# Patient Record
Sex: Female | Born: 1975
Health system: Southern US, Community
[De-identification: ages and names within clinical notes are randomized; demographics above are authoritative.]

## PROBLEM LIST (undated history)

## (undated) DIAGNOSIS — L659 Nonscarring hair loss, unspecified: Secondary | ICD-10-CM

## (undated) DIAGNOSIS — Z803 Family history of malignant neoplasm of breast: Secondary | ICD-10-CM

## (undated) DIAGNOSIS — K219 Gastro-esophageal reflux disease without esophagitis: Secondary | ICD-10-CM

## (undated) DIAGNOSIS — K625 Hemorrhage of anus and rectum: Secondary | ICD-10-CM

## (undated) DIAGNOSIS — Z1371 Encounter for nonprocreative screening for genetic disease carrier status: Secondary | ICD-10-CM

## (undated) DIAGNOSIS — K649 Unspecified hemorrhoids: Secondary | ICD-10-CM

## (undated) HISTORY — PX: NO PAST SURGERIES: SHX2092

## (undated) HISTORY — DX: Hemorrhage of anus and rectum: K62.5

## (undated) HISTORY — DX: Nonscarring hair loss, unspecified: L65.9

## (undated) HISTORY — DX: Encounter for nonprocreative screening for genetic disease carrier status: Z13.71

## (undated) HISTORY — DX: Family history of malignant neoplasm of breast: Z80.3

## (undated) HISTORY — DX: Unspecified hemorrhoids: K64.9

## (undated) HISTORY — DX: Gastro-esophageal reflux disease without esophagitis: K21.9

---

## 2005-10-06 ENCOUNTER — Ambulatory Visit: Payer: Self-pay

## 2005-10-12 ENCOUNTER — Emergency Department: Payer: Self-pay | Admitting: Emergency Medicine

## 2007-05-17 ENCOUNTER — Emergency Department: Payer: Self-pay | Admitting: Emergency Medicine

## 2007-10-21 ENCOUNTER — Ambulatory Visit: Payer: Self-pay | Admitting: General Surgery

## 2009-01-03 ENCOUNTER — Ambulatory Visit: Payer: Self-pay

## 2010-09-16 ENCOUNTER — Ambulatory Visit: Payer: Self-pay | Admitting: Internal Medicine

## 2010-12-12 ENCOUNTER — Ambulatory Visit: Payer: Self-pay | Admitting: Unknown Physician Specialty

## 2010-12-19 ENCOUNTER — Ambulatory Visit: Payer: Self-pay | Admitting: Family Medicine

## 2012-10-02 LAB — HM PAP SMEAR: HM PAP: NORMAL

## 2012-12-24 ENCOUNTER — Other Ambulatory Visit: Payer: Self-pay

## 2012-12-24 LAB — COMPREHENSIVE METABOLIC PANEL
BUN: 10 mg/dL (ref 7–18)
Bilirubin,Total: 0.8 mg/dL (ref 0.2–1.0)
Chloride: 104 mmol/L (ref 98–107)
Creatinine: 0.79 mg/dL (ref 0.60–1.30)
EGFR (Non-African Amer.): >60
Glucose: 85 mg/dL (ref 65–99)
Osmolality: 270 (ref 275–301)
SGOT(AST): 22 U/L (ref 15–37)
Sodium: 136 mmol/L (ref 136–145)

## 2012-12-24 LAB — CBC WITH DIFFERENTIAL/PLATELET
Basophil #: 0 10*3/uL (ref 0.0–0.1)
Basophil %: 0.7 %
HCT: 36.3 % (ref 35.0–47.0)
HGB: 12.3 g/dL (ref 12.0–16.0)
Lymphocyte #: 1.8 10*3/uL (ref 1.0–3.6)
Lymphocyte %: 40.4 %
Monocyte %: 8 %
Neutrophil #: 2.1 10*3/uL (ref 1.4–6.5)
Neutrophil %: 49 %
Platelet: 248 10*3/uL (ref 150–440)
RBC: 3.95 10*6/uL (ref 3.80–5.20)
WBC: 4.3 10*3/uL (ref 3.6–11.0)

## 2012-12-24 LAB — LIPID PANEL
Cholesterol: 182 mg/dL (ref 0–200)
HDL Cholesterol: 65 mg/dL — ABNORMAL HIGH (ref 40–60)
Ldl Cholesterol, Calc: 110 mg/dL — ABNORMAL HIGH (ref 0–100)
Triglycerides: 33 mg/dL (ref 0–200)
VLDL Cholesterol, Calc: 7 mg/dL (ref 5–40)

## 2012-12-24 LAB — TSH: Thyroid Stimulating Horm: 0.469 u[IU]/mL

## 2012-12-24 LAB — HEMOGLOBIN A1C: Hemoglobin A1C: 5.6 % (ref 4.2–6.3)

## 2013-01-25 ENCOUNTER — Ambulatory Visit: Payer: Self-pay | Admitting: General Surgery

## 2013-02-10 ENCOUNTER — Encounter: Payer: Self-pay | Admitting: General Surgery

## 2013-02-10 ENCOUNTER — Ambulatory Visit (INDEPENDENT_AMBULATORY_CARE_PROVIDER_SITE_OTHER): Payer: 59 | Admitting: General Surgery

## 2013-02-10 VITALS — BP 130/80 | HR 76 | Resp 14 | Ht 64.0 in | Wt 180.0 lb

## 2013-02-10 DIAGNOSIS — K625 Hemorrhage of anus and rectum: Secondary | ICD-10-CM | POA: Insufficient documentation

## 2013-02-10 HISTORY — DX: Hemorrhage of anus and rectum: K62.5

## 2013-02-10 LAB — HEMOCCULT GUIAC POC 1CARD (OFFICE): Fecal Occult Blood, POC: NEGATIVE

## 2013-02-10 NOTE — Progress Notes (Signed)
Patient ID: Kelly Sparks, female   DOB: [birth year], 37 y.o.   MRN: [id number]  Chief Complaint  Patient presents with  . Rectal Bleeding    HPI Kelly Sparks is a 37 y.o. female.  Patient here today for evaluation of rectal bleeding for about 2 months.  Patient referred by Dr Luella Cook. Patient state she have not been bleeding in the last month. Has a history of hemorrhoids.No family history of colon cancer.  States she does have trouble with constipation, bleeding on tissue and in toilet.  In the past she has seen blood in stool. She has known history of hemorrhoids and uses preparation H cream occasionally. The patient reports a long history of intermittent passage of bright red blood per rectum, especially when straining at stool. The most recent episode 2 months ago was unusual in that she would be aware of blood dripping in the toilet bowl when urinating. This is now resolved, and she reports no bleeding for the past month.  There is no history of anorectal trauma. HPI  Past Medical History  Diagnosis Date  . Hemorrhoids   . Rectal bleeding 02/10/2013    History reviewed. No pertinent past surgical history.  Family History  Problem Relation Age of Onset  . Breast cancer Maternal Aunt   . Cancer Father     lung    Social History History  Substance Use Topics  . Smoking status: Never Smoker   . Smokeless tobacco: Never Used  . Alcohol Use: No    No Known Allergies  No current outpatient prescriptions on file.   No current facility-administered medications for this visit.    Review of Systems Review of Systems  Constitutional: Negative.   Respiratory: Negative.   Cardiovascular: Negative.   Gastrointestinal: Positive for constipation.    Blood pressure 130/80, pulse 76, resp. rate 14, height 5\' 4"  (1.626 m), weight 180 lb (81.647 kg), last menstrual period 01/18/2013.  Physical Exam Physical Exam  Constitutional: She is oriented to person, place, and time. She  appears well-developed and well-nourished.  Cardiovascular: Normal rate and regular rhythm.   Pulmonary/Chest: Effort normal and breath sounds normal.  Abdominal: Soft. Bowel sounds are normal.  Neurological: She is alert and oriented to person, place, and time.  Skin: Skin is warm and dry.    Data Reviewed  Anoscopy showed the visualized lower rectal mucosa to be normal. No internal or external hemorrhoids noted. No fissure. Stool is Hemoccult negative.  Assessment    Likely anorectal source, very unlikely colon cancer based on the patient's report of blood passage per rectum with urination.     Plan    The patient is presently asymptomatic. She was encouraged to make use of fiber supplements to improve the stool frequency and ease of passage.  Should she develop recurrent bleeding, she was encouraged to call promptly for early assessment. We'll consider colonoscopy at that time if recurrent bleeding develops. In the absence of any family history of colon cancer or polyps, I don't believe this is necessary at this time.        [name] 02/10/2013, 10:04 PM

## 2013-02-10 NOTE — Patient Instructions (Addendum)
Increase fiber in diet, Fibercon or wafers etc.  If bleeding continues or worsens then consider colonoscopy

## 2013-03-12 ENCOUNTER — Ambulatory Visit: Payer: Self-pay | Admitting: Family Medicine

## 2013-03-12 LAB — OCCULT BLOOD X 1 CARD TO LAB, STOOL: Occult Blood, Feces: NEGATIVE

## 2014-02-10 ENCOUNTER — Encounter (INDEPENDENT_AMBULATORY_CARE_PROVIDER_SITE_OTHER): Payer: Self-pay

## 2014-02-10 ENCOUNTER — Encounter: Payer: Self-pay | Admitting: Internal Medicine

## 2014-02-10 ENCOUNTER — Ambulatory Visit (INDEPENDENT_AMBULATORY_CARE_PROVIDER_SITE_OTHER): Payer: 59 | Admitting: Internal Medicine

## 2014-02-10 VITALS — BP 118/72 | HR 66 | Temp 98.6°F | Ht 63.75 in | Wt 179.0 lb

## 2014-02-10 DIAGNOSIS — Z0000 Encounter for general adult medical examination without abnormal findings: Secondary | ICD-10-CM

## 2014-02-10 LAB — CBC
HEMATOCRIT: 35.4 % — AB (ref 36.0–46.0)
HEMOGLOBIN: 12.2 g/dL (ref 12.0–15.0)
MCH: 30.9 pg (ref 26.0–34.0)
MCHC: 34.5 g/dL (ref 30.0–36.0)
MCV: 89.6 fL (ref 78.0–100.0)
Platelets: 296 10*3/uL (ref 150–400)
RBC: 3.95 MIL/uL (ref 3.87–5.11)
RDW: 13.9 % (ref 11.5–15.5)
WBC: 6.4 10*3/uL (ref 4.0–10.5)

## 2014-02-10 LAB — HEMOGLOBIN A1C
Hgb A1c MFr Bld: 5.8 % — ABNORMAL HIGH (ref <5.7)
Mean Plasma Glucose: 120 mg/dL — ABNORMAL HIGH (ref <117)

## 2014-02-10 NOTE — Progress Notes (Signed)
Pre visit review using our clinic review tool, if applicable. No additional management support is needed unless otherwise documented below in the visit note. 

## 2014-02-10 NOTE — Patient Instructions (Addendum)

## 2014-02-10 NOTE — Progress Notes (Signed)
HPI  Pt presents to the clinic today to establish care. She has not had a PCP in many years.  Flu: 06/2013 Tetanus: 2013 LMP: 02/01/14 Pap Smear: 2014 Mammogram: 2013 Dentist: biannually  Past Medical History  Diagnosis Date  . Hemorrhoids   . Rectal bleeding 02/10/2013    Current Outpatient Prescriptions  Medication Sig Dispense Refill  . Multiple Vitamin (MULTIVITAMIN) tablet Take 1 tablet by mouth daily.      . vitamin B-12 (CYANOCOBALAMIN) 1000 MCG tablet Take 1,000 mcg by mouth daily.       No current facility-administered medications for this visit.    No Known Allergies  Family History  Problem Relation Age of Onset  . Breast cancer Maternal Aunt   . Cancer Maternal Aunt     Breast  . Cancer Father     lung  . Hyperlipidemia Father   . Hypertension Father   . Hyperlipidemia Mother   . Hypertension Mother   . Diabetes Maternal Uncle     History   Social History  . Marital Status: Married    Spouse Name: N/A    Number of Children: N/A  . Years of Education: N/A   Occupational History  . Not on file.   Social History Main Topics  . Smoking status: Never Smoker   . Smokeless tobacco: Never Used  . Alcohol Use: Yes     Comment: occasional  . Drug Use: No  . Sexual Activity: Not on file   Other Topics Concern  . Not on file   Social History Narrative  . No narrative on file    ROS:  Constitutional: Denies fever, malaise, fatigue, headache or abrupt weight changes.  HEENT: Denies eye pain, eye redness, ear pain, ringing in the ears, wax buildup, runny nose, nasal congestion, bloody nose, or sore throat. Respiratory: Denies difficulty breathing, shortness of breath, cough or sputum production.   Cardiovascular: Denies chest pain, chest tightness, palpitations or swelling in the hands or feet.  Gastrointestinal: Denies abdominal pain, bloating, constipation, diarrhea or blood in the stool.  GU: Denies frequency, urgency, pain with urination, blood  in urine, odor or discharge. Musculoskeletal: Denies decrease in range of motion, difficulty with gait, muscle pain or joint pain and swelling.  Skin: Denies redness, rashes, lesions or ulcercations.  Neurological: Denies dizziness, difficulty with memory, difficulty with speech or problems with balance and coordination.   No other specific complaints in a complete review of systems (except as listed in HPI above).  PE:  BP 118/72  Pulse 66  Temp(Src) 98.6 F (37 C) (Oral)  Ht 5' 3.75" (1.619 m)  Wt 179 lb (81.194 kg)  BMI 30.98 kg/m2  SpO2 99%  LMP 02/03/2014 Wt Readings from Last 3 Encounters:  02/10/14 179 lb (81.194 kg)  02/10/13 180 lb (81.647 kg)    General: Appears her stated age, well developed, well nourished in NAD. HEENT: Head: normal shape and size; Eyes: sclera white, no icterus, conjunctiva pink, PERRLA and EOMs intact; Ears: Tm's gray and intact, normal light reflex; Nose: mucosa pink and moist, septum midline; Throat/Mouth: Teeth present, mucosa pink and moist, no lesions or ulcerations noted.  Neck: Normal range of motion. Neck supple, trachea midline. No massses, lumps or thyromegaly present.  Cardiovascular: Normal rate and rhythm. S1,S2 noted.  No murmur, rubs or gallops noted. No JVD or BLE edema. No carotid bruits noted. Pulmonary/Chest: Normal effort and positive vesicular breath sounds. No respiratory distress. No wheezes, rales or ronchi noted.  Abdomen:  Soft and nontender. Normal bowel sounds, no bruits noted. No distention or masses noted. Liver, spleen and kidneys non palpable. Musculoskeletal: Normal range of motion. No signs of joint swelling. No difficulty with gait.  Neurological: Alert and oriented. Cranial nerves II-XII intact. Coordination normal. +DTRs bilaterally. Psychiatric: Mood and affect normal. Behavior is normal. Judgment and thought content normal.      Assessment and Plan:  Preventative Health Maintenance:  All HM UTD Will check  screening labs today

## 2014-02-11 LAB — COMPREHENSIVE METABOLIC PANEL
ALBUMIN: 4.3 g/dL (ref 3.5–5.2)
ALK PHOS: 64 U/L (ref 39–117)
ALT: 12 U/L (ref 0–35)
AST: 16 U/L (ref 0–37)
BILIRUBIN TOTAL: 0.5 mg/dL (ref 0.2–1.2)
BUN: 10 mg/dL (ref 6–23)
CO2: 26 mEq/L (ref 19–32)
CREATININE: 0.87 mg/dL (ref 0.50–1.10)
Calcium: 9.2 mg/dL (ref 8.4–10.5)
Chloride: 103 mEq/L (ref 96–112)
GLUCOSE: 92 mg/dL (ref 70–99)
POTASSIUM: 4.2 meq/L (ref 3.5–5.3)
Sodium: 138 mEq/L (ref 135–145)
Total Protein: 6.9 g/dL (ref 6.0–8.3)

## 2014-02-11 LAB — LIPID PANEL
CHOL/HDL RATIO: 2.5 ratio
CHOLESTEROL: 162 mg/dL (ref 0–200)
HDL: 66 mg/dL (ref >39)
LDL CALC: 85 mg/dL (ref 0–99)
TRIGLYCERIDES: 53 mg/dL (ref <150)
VLDL: 11 mg/dL (ref 0–40)

## 2014-02-11 LAB — TSH: TSH: 0.436 u[IU]/mL (ref 0.350–4.500)

## 2014-06-19 ENCOUNTER — Ambulatory Visit: Payer: 59

## 2014-07-03 ENCOUNTER — Encounter: Payer: Self-pay | Admitting: Internal Medicine

## 2015-03-23 ENCOUNTER — Ambulatory Visit (INDEPENDENT_AMBULATORY_CARE_PROVIDER_SITE_OTHER): Payer: 59 | Admitting: Family Medicine

## 2015-03-23 VITALS — BP 102/72 | HR 94 | Temp 98.5°F | Resp 18 | Ht 64.0 in | Wt 174.0 lb

## 2015-03-23 DIAGNOSIS — J020 Streptococcal pharyngitis: Secondary | ICD-10-CM | POA: Diagnosis not present

## 2015-03-23 MED ORDER — MAGIC MOUTHWASH W/LIDOCAINE: 5.0000 mL | Freq: Four times a day (QID) | ORAL | Status: DC | PRN

## 2015-03-23 NOTE — Patient Instructions (Signed)
Continue the ibuprofen, cold fluids, lozenges as needed, magic mouthwash as needed. Continue amoxicillin. Watch for any swelling in back of throat as discussed and return here or emergency room if this occurs. Return to the clinic or go to the nearest emergency room if any of your symptoms worsen or new symptoms occur.  Strep Throat Strep throat is an infection of the throat caused by a bacteria named Streptococcus pyogenes. Your health care provider may call the infection streptococcal "tonsillitis" or "pharyngitis" depending on whether there are signs of inflammation in the tonsils or back of the throat. Strep throat is most common in children aged 5-15 years during the cold months of the year, but it can occur in people of any age during any season. This infection is spread from person to person (contagious) through coughing, sneezing, or other close contact. SIGNS AND SYMPTOMS   Fever or chills.  Painful, swollen, red tonsils or throat.  Pain or difficulty when swallowing.  White or yellow spots on the tonsils or throat.  Swollen, tender lymph nodes or "glands" of the neck or under the jaw.  Red rash all over the body (rare). DIAGNOSIS  Many different infections can cause the same symptoms. A test must be done to confirm the diagnosis so the right treatment can be given. A "rapid strep test" can help your health care provider make the diagnosis in a few minutes. If this test is not available, a light swab of the infected area can be used for a throat culture test. If a throat culture test is done, results are usually available in a day or two. TREATMENT  Strep throat is treated with antibiotic medicine. HOME CARE INSTRUCTIONS   Gargle with 1 tsp of salt in 1 cup of warm water, 3-4 times per day or as needed for comfort.  Family members who also have a sore throat or fever should be tested for strep throat and treated with antibiotics if they have the strep infection.  Make sure  everyone in your household washes their hands well.  Do not share food, drinking cups, or personal items that could cause the infection to spread to others.  You may need to eat a soft food diet until your sore throat gets better.  Drink enough water and fluids to keep your urine clear or pale yellow. This will help prevent dehydration.  Get plenty of rest.  Stay home from school, day care, or work until you have been on antibiotics for 24 hours.  Take medicines only as directed by your health care provider.  Take your antibiotic medicine as directed by your health care provider. Finish it even if you start to feel better. SEEK MEDICAL CARE IF:   The glands in your neck continue to enlarge.  You develop a rash, cough, or earache.  You cough up green, yellow-brown, or bloody sputum.  You have pain or discomfort not controlled by medicines.  Your problems seem to be getting worse rather than better.  You have a fever. SEEK IMMEDIATE MEDICAL CARE IF:   You develop any new symptoms such as vomiting, severe headache, stiff or painful neck, chest pain, shortness of breath, or trouble swallowing.  You develop severe throat pain, drooling, or changes in your voice.  You develop swelling of the neck, or the skin on the neck becomes red and tender.  You develop signs of dehydration, such as fatigue, dry mouth, and decreased urination.  You become increasingly sleepy, or you cannot wake up  completely. MAKE SURE YOU:  Understand these instructions.  Will watch your condition.  Will get help right away if you are not doing well or get worse. Document Released: 08/15/2000 Document Revised: 01/02/2014 Document Reviewed: 10/17/2010 [facility] Patient Information 2015 [place], [place]. This information is not intended to replace advice given to you by your health care provider. Make sure you discuss any questions you have with your health care provider.

## 2015-03-23 NOTE — Progress Notes (Addendum)
Subjective:  This chart was scribed for Meredith Staggers, MD by [facility], medical scribe at [facility].The patient was seen in exam room 01 and the patient's care was started at 10:15 AM.   Patient ID: Kelly Sparks, female    DOB: November 24, 1975, 39 y.o.   MRN: [id number] Chief Complaint  Patient presents with  . Sore Throat    x 2 days, getting worse, stiff neck   HPI HPI Comments: Kelly Sparks is a 39 y.o. female who presents to [facility] complaining of a sore throat and body aches began three days ago. A coworker did have strep throat. She was seen at the work clinic and diagnosed with strep throat two days, rapid strep test was positive. She was treated with amoxicillin 875 mg, five total doses, and no missed doses. Possible double doses today, she is unsure though.  Today, she is concerned because her symptoms have not improved after 48 hours of antibiotics. She has pain with swallowing, neck stiffness, and developed bilateral ear pain. She also complains of a subjective fever. Taking ibuprofen and Vicodin which is 39 years old for the fever.  There are no active problems to display for this patient.  Past Medical History  Diagnosis Date  . Hemorrhoids   . Rectal bleeding 02/10/2013   History reviewed. No pertinent past surgical history. No Known Allergies Prior to Admission medications   Medication Sig Start Date End Date Taking? Authorizing Provider  amoxicillin (AMOXIL) 875 MG tablet Take 875 mg by mouth 2 (two) times daily.   Yes Historical Provider, MD  hydrocodone-ibuprofen (VICO PROFEN) 5-200 MG per tablet Take 1 tablet by mouth every 8 (eight) hours as needed for pain.   Yes Historical Provider, MD  ibuprofen (ADVIL,MOTRIN) 800 MG tablet Take 800 mg by mouth every 8 (eight) hours as needed.   Yes Historical Provider, MD  Multiple Vitamin (MULTIVITAMIN) tablet Take 1 tablet by mouth daily.    Historical Provider, MD  vitamin  B-12 (CYANOCOBALAMIN) 1000 MCG tablet Take 1,000 mcg by mouth daily.    Historical Provider, MD   History   Social History  . Marital Status: Married    Spouse Name: N/A  . Number of Children: N/A  . Years of Education: N/A   Occupational History  . Not on file.   Social History Main Topics  . Smoking status: Never Smoker   . Smokeless tobacco: Never Used  . Alcohol Use: 0.5 oz/week    1 drink(s) per week     Comment: occasional  . Drug Use: No  . Sexual Activity: Yes   Other Topics Concern  . Not on file   Social History Narrative   Review of Systems  Constitutional: Positive for fever.  HENT: Positive for ear pain, sore throat and trouble swallowing.   Musculoskeletal: Positive for myalgias and neck stiffness.      Objective:  BP 102/72 mmHg  Pulse 94  Temp(Src) 98.5 F (36.9 C)  Resp 18  Ht 5\' 4"  (1.626 m)  Wt 174 lb (78.926 kg)  BMI 29.85 kg/m2  SpO2 98%  LMP 03/09/2015 Physical Exam  Constitutional: She is oriented to person, place, and time. She appears well-developed and well-nourished. No distress.  HENT:  Head: Normocephalic and atraumatic.  Right Ear: Hearing, tympanic membrane, external ear and ear canal normal.  Left Ear: Hearing, tympanic membrane, external ear and ear canal normal.  Nose: Nose normal.  Mouth/Throat: Oropharynx  is clear and moist. No oropharyngeal exudate.  TM were pearly grey, canal were clear. Minimal tonsillar hypertrophy, exudate on bilateral tonsils. No abscess. Uvula midline.  Eyes: Conjunctivae and EOM are normal. Pupils are equal, round, and reactive to light.  Neck: Normal range of motion. Neck supple.  Enlarged tender AC node left sided, and a tender right sided AC node which is not enlarged.  Cardiovascular: Normal rate, regular rhythm, normal heart sounds and intact distal pulses.   No murmur heard. Pulmonary/Chest: Effort normal and breath sounds normal. No respiratory distress. She has no wheezes. She has no  rhonchi.  Musculoskeletal: Normal range of motion.  Neurological: She is alert and oriented to person, place, and time.  Skin: Skin is warm and dry. No rash noted.  Psychiatric: She has a normal mood and affect. Her behavior is normal.  Nursing note and vitals reviewed.     Assessment & Plan:   Kelly Sparks is a 39 y.o. female Streptococcal sore throat - Plan: Alum & Mag Hydroxide-Simeth (MAGIC MOUTHWASH W/LIDOCAINE) SOLN  -no sign of peritonsilar abscess and tolerating amoxicillin. Afebrile.   -continue sx care, rtc/ER precautions discussed including PTA signs/symptoms, and if not improving into next week - consider change to Cleocin for resistant strep.    Meds ordered this encounter  Medications  . amoxicillin (AMOXIL) 875 MG tablet    Sig: Take 875 mg by mouth 2 (two) times daily.  . hydrocodone-ibuprofen (VICOPROFEN) 5-200 MG per tablet    Sig: Take 1 tablet by mouth every 8 (eight) hours as needed for pain.  Marland Kitchen ibuprofen (ADVIL,MOTRIN) 800 MG tablet    Sig: Take 800 mg by mouth every 8 (eight) hours as needed.  . Alum & Mag Hydroxide-Simeth (MAGIC MOUTHWASH W/LIDOCAINE) SOLN    Sig: Take 5 mLs by mouth 4 (four) times daily as needed for mouth pain.    Dispense:  120 mL    Refill:  0    Ok to substitute ingredients per pharmacy usual "magic mouthwash" prep.   Patient Instructions  Continue the ibuprofen, cold fluids, lozenges as needed, magic mouthwash as needed. Continue amoxicillin. Watch for any swelling in back of throat as discussed and return here or emergency room if this occurs. Return to the clinic or go to the nearest emergency room if any of your symptoms worsen or new symptoms occur.  Strep Throat Strep throat is an infection of the throat caused by a bacteria named Streptococcus pyogenes. Your health care provider may call the infection streptococcal "tonsillitis" or "pharyngitis" depending on whether there are signs of inflammation in the tonsils or back of the  throat. Strep throat is most common in children aged 5-15 years during the cold months of the year, but it can occur in people of any age during any season. This infection is spread from person to person (contagious) through coughing, sneezing, or other close contact. SIGNS AND SYMPTOMS   Fever or chills.  Painful, swollen, red tonsils or throat.  Pain or difficulty when swallowing.  White or yellow spots on the tonsils or throat.  Swollen, tender lymph nodes or "glands" of the neck or under the jaw.  Red rash all over the body (rare). DIAGNOSIS  Many different infections can cause the same symptoms. A test must be done to confirm the diagnosis so the right treatment can be given. A "rapid strep test" can help your health care provider make the diagnosis in a few minutes. If this test is not available,  a light swab of the infected area can be used for a throat culture test. If a throat culture test is done, results are usually available in a day or two. TREATMENT  Strep throat is treated with antibiotic medicine. HOME CARE INSTRUCTIONS   Gargle with 1 tsp of salt in 1 cup of warm water, 3-4 times per day or as needed for comfort.  Family members who also have a sore throat or fever should be tested for strep throat and treated with antibiotics if they have the strep infection.  Make sure everyone in your household washes their hands well.  Do not share food, drinking cups, or personal items that could cause the infection to spread to others.  You may need to eat a soft food diet until your sore throat gets better.  Drink enough water and fluids to keep your urine clear or pale yellow. This will help prevent dehydration.  Get plenty of rest.  Stay home from school, day care, or work until you have been on antibiotics for 24 hours.  Take medicines only as directed by your health care provider.  Take your antibiotic medicine as directed by your health care provider. Finish it even  if you start to feel better. SEEK MEDICAL CARE IF:   The glands in your neck continue to enlarge.  You develop a rash, cough, or earache.  You cough up green, yellow-brown, or bloody sputum.  You have pain or discomfort not controlled by medicines.  Your problems seem to be getting worse rather than better.  You have a fever. SEEK IMMEDIATE MEDICAL CARE IF:   You develop any new symptoms such as vomiting, severe headache, stiff or painful neck, chest pain, shortness of breath, or trouble swallowing.  You develop severe throat pain, drooling, or changes in your voice.  You develop swelling of the neck, or the skin on the neck becomes red and tender.  You develop signs of dehydration, such as fatigue, dry mouth, and decreased urination.  You become increasingly sleepy, or you cannot wake up completely. MAKE SURE YOU:  Understand these instructions.  Will watch your condition.  Will get help right away if you are not doing well or get worse. Document Released: 08/15/2000 Document Revised: 01/02/2014 Document Reviewed: 10/17/2010 [facility] Patient Information 2015 [place], [place]. This information is not intended to replace advice given to you by your health care provider. Make sure you discuss any questions you have with your health care provider.      I personally performed the services described in this documentation, which was scribed in my presence. The recorded information has been reviewed and considered, and addended by me as needed.

## 2015-05-04 ENCOUNTER — Other Ambulatory Visit (HOSPITAL_COMMUNITY)
Admission: RE | Admit: 2015-05-04 | Discharge: 2015-05-04 | Disposition: A | Discharge status: Home / Self Care | Payer: 59 | Source: Ambulatory Visit | Attending: Internal Medicine | Admitting: Internal Medicine

## 2015-05-04 ENCOUNTER — Encounter: Payer: Self-pay | Admitting: Internal Medicine

## 2015-05-04 ENCOUNTER — Ambulatory Visit (INDEPENDENT_AMBULATORY_CARE_PROVIDER_SITE_OTHER): Payer: 59 | Admitting: Internal Medicine

## 2015-05-04 VITALS — BP 120/84 | HR 84 | Temp 98.5°F | Ht 64.0 in | Wt 175.0 lb

## 2015-05-04 DIAGNOSIS — Z01411 Encounter for gynecological examination (general) (routine) with abnormal findings: Secondary | ICD-10-CM | POA: Insufficient documentation

## 2015-05-04 DIAGNOSIS — Z1151 Encounter for screening for human papillomavirus (HPV): Secondary | ICD-10-CM | POA: Insufficient documentation

## 2015-05-04 DIAGNOSIS — Z0000 Encounter for general adult medical examination without abnormal findings: Secondary | ICD-10-CM | POA: Diagnosis not present

## 2015-05-04 DIAGNOSIS — Z124 Encounter for screening for malignant neoplasm of cervix: Secondary | ICD-10-CM

## 2015-05-04 LAB — CBC
HEMATOCRIT: 36.5 % (ref 36.0–46.0)
HEMOGLOBIN: 12.4 g/dL (ref 12.0–15.0)
MCH: 31.2 pg (ref 26.0–34.0)
MCHC: 34 g/dL (ref 30.0–36.0)
MCV: 91.7 fL (ref 78.0–100.0)
MPV: 10 fL (ref 8.6–12.4)
Platelets: 278 10*3/uL (ref 150–400)
RBC: 3.98 MIL/uL (ref 3.87–5.11)
RDW: 13.8 % (ref 11.5–15.5)
WBC: 5.7 10*3/uL (ref 4.0–10.5)

## 2015-05-04 LAB — TSH: TSH: 0.442 u[IU]/mL (ref 0.350–4.500)

## 2015-05-04 LAB — LIPID PANEL
CHOLESTEROL: 158 mg/dL (ref 125–200)
HDL: 65 mg/dL (ref >=46)
LDL CALC: 86 mg/dL (ref <130)
TRIGLYCERIDES: 37 mg/dL (ref <150)
Total CHOL/HDL Ratio: 2.4 Ratio (ref <=5.0)
VLDL: 7 mg/dL (ref <30)

## 2015-05-04 LAB — HEMOGLOBIN A1C
Hgb A1c MFr Bld: 5.8 % — ABNORMAL HIGH (ref <5.7)
Mean Plasma Glucose: 120 mg/dL — ABNORMAL HIGH (ref <117)

## 2015-05-04 LAB — COMPREHENSIVE METABOLIC PANEL
ALBUMIN: 4.1 g/dL (ref 3.6–5.1)
ALK PHOS: 61 U/L (ref 33–115)
ALT: 60 U/L — ABNORMAL HIGH (ref 6–29)
AST: 161 U/L — ABNORMAL HIGH (ref 10–30)
BUN: 7 mg/dL (ref 7–25)
CALCIUM: 9.3 mg/dL (ref 8.6–10.2)
CO2: 27 mmol/L (ref 20–31)
Chloride: 101 mmol/L (ref 98–110)
Creat: 0.73 mg/dL (ref 0.50–1.10)
GLUCOSE: 84 mg/dL (ref 65–99)
POTASSIUM: 4.1 mmol/L (ref 3.5–5.3)
Sodium: 139 mmol/L (ref 135–146)
Total Bilirubin: 0.8 mg/dL (ref 0.2–1.2)
Total Protein: 6.8 g/dL (ref 6.1–8.1)

## 2015-05-04 NOTE — Patient Instructions (Signed)

## 2015-05-04 NOTE — Progress Notes (Signed)
Subjective:    Patient ID: Kelly Sparks, female    DOB: 08/06/1976, 39 y.o.   MRN: [id number]  HPI  Pt presents to the clinic today for her annual exam.  Flu: 06/2013 Tetanus: 2013 LMP: 04/27/15 Pap Smear: 2014 Mammogram: 2013 Dentist: biannually  Diet: She eats red meat and lean meats. She eats fruits and veggies daily. She does consume some fried. She drinks mostly water. Exercise: She walks most days for about 30 minutes.  Review of Systems      Past Medical History  Diagnosis Date  . Hemorrhoids   . Rectal bleeding 02/10/2013    Current Outpatient Prescriptions  Medication Sig Dispense Refill  . Alum & Mag Hydroxide-Simeth (MAGIC MOUTHWASH W/LIDOCAINE) SOLN Take 5 mLs by mouth 4 (four) times daily as needed for mouth pain. 120 mL 0  . amoxicillin (AMOXIL) 875 MG tablet Take 875 mg by mouth 2 (two) times daily.    . hydrocodone-ibuprofen (VICOPROFEN) 5-200 MG per tablet Take 1 tablet by mouth every 8 (eight) hours as needed for pain.    Marland Kitchen ibuprofen (ADVIL,MOTRIN) 800 MG tablet Take 800 mg by mouth every 8 (eight) hours as needed.    . Multiple Vitamin (MULTIVITAMIN) tablet Take 1 tablet by mouth daily.    . vitamin B-12 (CYANOCOBALAMIN) 1000 MCG tablet Take 1,000 mcg by mouth daily.     No current facility-administered medications for this visit.    No Known Allergies  Family History  Problem Relation Age of Onset  . Breast cancer Maternal Aunt   . Cancer Maternal Aunt     Breast  . Cancer Father     lung  . Hyperlipidemia Father   . Hypertension Father   . Hyperlipidemia Mother   . Hypertension Mother   . Diabetes Maternal Uncle     Social History   Social History  . Marital Status: Married    Spouse Name: N/A  . Number of Children: N/A  . Years of Education: N/A   Occupational History  . Not on file.   Social History Main Topics  . Smoking status: Never Smoker   . Smokeless tobacco: Never Used  . Alcohol Use: 0.5 oz/week    1 drink(s)  per week     Comment: occasional  . Drug Use: No  . Sexual Activity: Yes   Other Topics Concern  . Not on file   Social History Narrative     Constitutional: Denies fever, malaise, fatigue, headache or abrupt weight changes.  HEENT: Denies eye pain, eye redness, ear pain, ringing in the ears, wax buildup, runny nose, nasal congestion, bloody nose, or sore throat. Respiratory: Denies difficulty breathing, shortness of breath, cough or sputum production.   Cardiovascular: Denies chest pain, chest tightness, palpitations or swelling in the hands or feet.  Gastrointestinal: Pt reports constipation. Denies abdominal pain, bloating, diarrhea or blood in the stool.  GU: Denies urgency, frequency, pain with urination, burning sensation, blood in urine, odor or discharge. Musculoskeletal: Denies decrease in range of motion, difficulty with gait, muscle pain or joint pain and swelling.  Skin: Denies redness, rashes, lesions or ulcercations.  Neurological: Denies dizziness, difficulty with memory, difficulty with speech or problems with balance and coordination.  Psych: Denies anxiety, depression, SI/HI.  No other specific complaints in a complete review of systems (except as listed in HPI above).  Objective:   Physical Exam   BP 120/84 mmHg  Pulse 84  Temp(Src) 98.5 F (36.9 C) (Oral)  Ht 5'  4" (1.626 m)  Wt 175 lb (79.379 kg)  BMI 30.02 kg/m2  SpO2 98%  LMP 04/27/2015 Wt Readings from Last 3 Encounters:  05/04/15 175 lb (79.379 kg)  03/23/15 174 lb (78.926 kg)  02/10/14 179 lb (81.194 kg)    General: Appears her stated age, well developed, well nourished in NAD. Skin: Warm, dry and intact. No rashes, lesions or ulcerations noted. HEENT: Head: normal shape and size; Eyes: sclera white, no icterus, conjunctiva pink, PERRLA and EOMs intact; Ears: Tm's gray and intact, normal light reflex; Throat/Mouth: Teeth present, mucosa pink and moist, no exudate, lesions or ulcerations noted.    Neck:  Neck supple, trachea midline. No masses, lumps or thyromegaly present.  Cardiovascular: Normal rate and rhythm. S1,S2 noted.  No murmur, rubs or gallops noted. No JVD or BLE edema. No carotid bruits noted. Pulmonary/Chest: Normal effort and positive vesicular breath sounds. No respiratory distress. No wheezes, rales or ronchi noted.  Abdomen: Soft and nontender. Normal bowel sounds, no bruits noted. No distention or masses noted. Liver, spleen and kidneys non palpable. Pelvic: Normal female anatomy. No discharge noted. No CMT. Adnexa non palpable. Musculoskeletal: Normal range of motion. Strength 5/5 BUE/BLE. No signs of joint swelling. No difficulty with gait.  Neurological: Alert and oriented. Cranial nerves II-XII grossly intact. Coordination normal.  Psychiatric: Mood and affect normal. Behavior is normal. Judgment and thought content normal.    BMET    Component Value Date/Time   NA 138 02/10/2014 1534   NA 136 12/24/2012 0845   K 4.2 02/10/2014 1534   K 3.8 12/24/2012 0845   CL 103 02/10/2014 1534   CL 104 12/24/2012 0845   CO2 26 02/10/2014 1534   CO2 26 12/24/2012 0845   GLUCOSE 92 02/10/2014 1534   GLUCOSE 85 12/24/2012 0845   BUN 10 02/10/2014 1534   BUN 10 12/24/2012 0845   CREATININE 0.87 02/10/2014 1534   CREATININE 0.79 12/24/2012 0845   CALCIUM 9.2 02/10/2014 1534   CALCIUM 8.7 12/24/2012 0845   GFRNONAA >60 12/24/2012 0845   GFRAA >60 12/24/2012 0845    Lipid Panel     Component Value Date/Time   CHOL 162 02/10/2014 1534   CHOL 182 12/24/2012 0845   TRIG 53 02/10/2014 1534   TRIG 33 12/24/2012 0845   HDL 66 02/10/2014 1534   HDL 65* 12/24/2012 0845   CHOLHDL 2.5 02/10/2014 1534   VLDL 11 02/10/2014 1534   VLDL 7 12/24/2012 0845   LDLCALC 85 02/10/2014 1534   LDLCALC 110* 12/24/2012 0845    CBC    Component Value Date/Time   WBC 6.4 02/10/2014 1534   WBC 4.3 12/24/2012 0845   RBC 3.95 02/10/2014 1534   RBC 3.95 12/24/2012 0845   HGB 12.2  02/10/2014 1534   HGB 12.3 12/24/2012 0845   HCT 35.4* 02/10/2014 1534   HCT 36.3 12/24/2012 0845   PLT 296 02/10/2014 1534   PLT 248 12/24/2012 0845   MCV 89.6 02/10/2014 1534   MCV 92 12/24/2012 0845   MCH 30.9 02/10/2014 1534   MCH 31.1 12/24/2012 0845   MCHC 34.5 02/10/2014 1534   MCHC 33.8 12/24/2012 0845   RDW 13.9 02/10/2014 1534   RDW 12.9 12/24/2012 0845   LYMPHSABS 1.8 12/24/2012 0845   MONOABS 0.3 12/24/2012 0845   EOSABS 0.1 12/24/2012 0845   BASOSABS 0.0 12/24/2012 0845    Hgb A1C Lab Results  Component Value Date   HGBA1C 5.8* 02/10/2014  Assessment & Plan:   Preventative Health Maintenance:  Encouraged her to get a flu shot in the fall 2016 Tetanus UTD Pap Smear today Will start mammograms at age 44 Encouraged her to continue seeing a dentist biannually Encouraged her to consume a balanced diet, low in cholesterol and salt, with lots of fresh fruits and veggies Will check CBC, CMET and Lipid Profile today  RTC in 1 year for annual exam

## 2015-05-04 NOTE — Progress Notes (Signed)
Pre visit review using our clinic review tool, if applicable. No additional management support is needed unless otherwise documented below in the visit note. 

## 2015-05-09 LAB — CYTOLOGY - PAP

## 2015-05-11 ENCOUNTER — Encounter: Payer: 59 | Admitting: Internal Medicine

## 2015-05-12 ENCOUNTER — Ambulatory Visit (INDEPENDENT_AMBULATORY_CARE_PROVIDER_SITE_OTHER): Payer: 59 | Admitting: Family Medicine

## 2015-05-12 VITALS — BP 110/78 | HR 68 | Temp 97.9°F | Resp 16 | Ht 65.0 in | Wt 174.8 lb

## 2015-05-12 DIAGNOSIS — R945 Abnormal results of liver function studies: Secondary | ICD-10-CM | POA: Insufficient documentation

## 2015-05-12 DIAGNOSIS — Z23 Encounter for immunization: Secondary | ICD-10-CM

## 2015-05-12 DIAGNOSIS — R7989 Other specified abnormal findings of blood chemistry: Secondary | ICD-10-CM

## 2015-05-12 LAB — COMPLETE METABOLIC PANEL WITH GFR
ALT: 38 U/L — ABNORMAL HIGH (ref 6–29)
AST: 29 U/L (ref 10–30)
Albumin: 4.2 g/dL (ref 3.6–5.1)
Alkaline Phosphatase: 55 U/L (ref 33–115)
BUN: 9 mg/dL (ref 7–25)
CO2: 24 mmol/L (ref 20–31)
Calcium: 9.2 mg/dL (ref 8.6–10.2)
Chloride: 105 mmol/L (ref 98–110)
Creat: 0.77 mg/dL (ref 0.50–1.10)
GFR, Est African American: >89 mL/min (ref >=60)
GFR, Est Non African American: >89 mL/min (ref >=60)
Glucose, Bld: 88 mg/dL (ref 65–99)
Potassium: 4.4 mmol/L (ref 3.5–5.3)
Sodium: 137 mmol/L (ref 135–146)
Total Bilirubin: 0.7 mg/dL (ref 0.2–1.2)
Total Protein: 7.1 g/dL (ref 6.1–8.1)

## 2015-05-12 LAB — IRON AND TIBC
%SAT: 15 % (ref 11–50)
Iron: 46 ug/dL (ref 40–190)
TIBC: 311 ug/dL (ref 250–450)
UIBC: 265 ug/dL (ref 125–400)

## 2015-05-12 NOTE — Patient Instructions (Signed)
Abnormal liver function can result from quite a variety of problems. You're not  drinking excessive alcohol or taking any medicines that would do this so we can eliminate that possibility. Other possible causes include lupus, Wilson's disease, hemochromatosis, biliary cirrhosis, and sarcoidosis. We are repeating your liver functions today in hopes of finding a normal liver function, but if there are abnormalities, further investigation will be needed.

## 2015-05-12 NOTE — Progress Notes (Addendum)
 @  This chart was scribed for Kelly Sidle, MD by Kelly Sparks, ED Scribe. This patient was seen in room 8 and the patient's care was started at 10:29 AM.  Patient ID: Kelly Sparks MRN: [id number], DOB: 02/29/1976, 39 y.o. Date of Encounter: 05/12/2015, 10:29 AM  Primary Physician: Kelly Reaper, NP  Chief Complaint:  Chief Complaint  Patient presents with   labs redone    alt,ast at Dr. Sampson Sparks office   Nausea    on off x 3months   Flu Vaccine    HPI: 39 y.o. year old female with history below presents with. Pt had an elevated function testing at Dr. Janith Sparks office and is here to day to have repeat testing. She denies any life style changes including diet, exercise, increase in alcohol intake and no elicit drug use. Pt has had intermittent nausea for the past 3 months but denies abdominal pain. She denies family hx of liver problems but does have  fam hx of Dm. She is not on birth control.   Pt is also requesting a flu shot.   Pt works at [employer].   Past Medical History  Diagnosis Date   Hemorrhoids    Rectal bleeding 02/10/2013     Home Meds: Prior to Admission medications   Medication Sig Start Date End Date Taking? Authorizing Provider  acetaminophen (TYLENOL) 500 MG tablet Take 500 mg by mouth every 6 (six) hours as needed.   Yes Historical Provider, MD  BIOTIN PO Take 1 capsule by mouth daily.   Yes Historical Provider, MD  Multiple Vitamin (MULTIVITAMIN) tablet Take 1 tablet by mouth daily.   Yes Historical Provider, MD  OVER THE COUNTER MEDICATION Take 1 tablet by mouth daily. Keratin   Yes Historical Provider, MD  phentermine 37.5 MG capsule Take 37.5 mg by mouth every morning.   Yes Historical Provider, MD  topiramate (TOPAMAX) 50 MG tablet Take 50 mg by mouth 2 (two) times daily.   Yes Historical Provider, MD  vitamin B-12 (CYANOCOBALAMIN) 1000 MCG tablet Take 1,000 mcg by mouth daily.   Yes Historical Provider, MD    Allergies: No  Known Allergies  Social History   Social History   Marital Status: Married    Spouse Name: N/A   Number of Children: N/A   Years of Education: N/A   Occupational History   Not on file.   Social History Main Topics   Smoking status: Never Smoker    Smokeless tobacco: Never Used   Alcohol Use: 0.6 oz/week    1 Standard drinks or equivalent per week     Comment: occasional   Drug Use: No   Sexual Activity: Yes   Other Topics Concern   Not on file   Social History Narrative     Review of Systems: Constitutional: negative for chills, fever, night sweats, weight changes, or fatigue  HEENT: negative for vision changes, hearing loss, congestion, rhinorrhea, ST, epistaxis, or sinus pressure Cardiovascular: negative for chest pain or palpitations Respiratory: negative for hemoptysis, wheezing, shortness of breath, or cough Abdominal: negative for abdominal pain, vomiting, diarrhea, or constipation Dermatological: negative for rash Neurologic: negative for headache, dizziness, or syncope All other systems reviewed and are otherwise negative with the exception to those above and in the HPI.   Physical Exam: Blood pressure 110/78, pulse 68, temperature 97.9 F (36.6 C), temperature source Oral, resp. rate 16, height  (1.651 m), weight 174 lb 12.8 oz (79.289 kg), last menstrual period 04/27/2015, SpO2  98 %., Body mass index is 29.09 kg/(m^2). General: Well developed, well nourished, in no acute distress. Head: Normocephalic, atraumatic, eyes without discharge, sclera non-icteric, nares are without discharge. Bilateral auditory canals clear, TM's are without perforation, pearly grey and translucent with reflective cone of light bilaterally. Oral cavity moist, posterior pharynx without exudate, erythema, peritonsillar abscess, or post nasal drip.  Neck: Supple. No thyromegaly. Full ROM. No lymphadenopathy. Lungs: Clear bilaterally to auscultation without wheezes, rales, or  rhonchi. Breathing is unlabored. Heart: RRR with S1 S2. No murmurs, rubs, or gallops appreciated. Abdomen: Soft, non-tender, non-distended with normoactive bowel sounds. No hepatomegaly. No rebound/guarding. No obvious abdominal masses. Msk:  Strength and tone normal for age. Extremities/Skin: Warm and dry. No clubbing or cyanosis. No edema. No rashes or suspicious lesions. Neuro: Alert and oriented X 3. Moves all extremities spontaneously. Gait is normal. CNII-XII grossly in tact. Psych:  Responds to questions appropriately with a normal affect.    ASSESSMENT AND PLAN:  39 y.o. year old female with  This chart was scribed in my presence and reviewed by me personally.    ICD-9-CM ICD-10-CM   1. Abnormal LFTs 790.6 R79.89 COMPLETE METABOLIC PANEL WITH GFR     Iron and TIBC  2. Need for immunization against influenza V04.81 Z23 Flu Vaccine QUAD 36+ mos IM   Signed, Kelly Sidle, MD 05/12/2015 10:29 AM

## 2015-05-16 ENCOUNTER — Encounter: Payer: Self-pay | Admitting: Family Medicine

## 2015-08-21 ENCOUNTER — Ambulatory Visit: Payer: Self-pay | Admitting: Physician Assistant

## 2015-08-21 ENCOUNTER — Encounter: Payer: Self-pay | Admitting: Physician Assistant

## 2015-08-21 ENCOUNTER — Other Ambulatory Visit
Admission: RE | Admit: 2015-08-21 | Discharge: 2015-08-21 | Disposition: A | Discharge status: Home / Self Care | Payer: 59 | Source: Ambulatory Visit | Attending: Physician Assistant | Admitting: Physician Assistant

## 2015-08-21 VITALS — BP 112/80 | HR 60 | Temp 98.5°F

## 2015-08-21 DIAGNOSIS — J039 Acute tonsillitis, unspecified: Secondary | ICD-10-CM | POA: Insufficient documentation

## 2015-08-21 LAB — MONONUCLEOSIS SCREEN: MONO SCREEN: NEGATIVE

## 2015-08-21 LAB — POCT RAPID STREP A (OFFICE): RAPID STREP A SCREEN: NEGATIVE

## 2015-08-21 MED ORDER — AZITHROMYCIN 250 MG PO TABS: ORAL_TABLET | ORAL | Status: DC

## 2015-08-21 MED ORDER — METHYLPREDNISOLONE 4 MG PO TBPK: ORAL_TABLET | ORAL | Status: DC

## 2015-08-21 MED ORDER — FLUCONAZOLE 150 MG PO TABS: 150.0000 mg | ORAL_TABLET | Freq: Once | ORAL | Status: DC

## 2015-08-21 NOTE — Progress Notes (Signed)
S: c/o sore throat, body aches, hurts to swallow, had strep throat about 2 months ago, denies sinus congestion, cough, cp/sob  O: vitals wnl, nad, tms clear, throat with red swollen tonsils with exudate, neck supple no lymph, lungs c t a, cv rrr, q strep neg  A: acute tonsillitis  P: zpack, medrol dose pack, mono test order sent to lab on written sheet

## 2015-09-03 ENCOUNTER — Ambulatory Visit
Admission: EM | Admit: 2015-09-03 | Discharge: 2015-09-03 | Disposition: A | Discharge status: Home / Self Care | Payer: 59 | Attending: Family Medicine | Admitting: Family Medicine

## 2015-09-03 ENCOUNTER — Encounter: Payer: Self-pay | Admitting: Emergency Medicine

## 2015-09-03 DIAGNOSIS — J039 Acute tonsillitis, unspecified: Secondary | ICD-10-CM

## 2015-09-03 DIAGNOSIS — J029 Acute pharyngitis, unspecified: Secondary | ICD-10-CM

## 2015-09-03 LAB — RAPID STREP SCREEN (MED CTR MEBANE ONLY): STREPTOCOCCUS, GROUP A SCREEN (DIRECT): NEGATIVE

## 2015-09-03 MED ORDER — AMOXICILLIN 875 MG PO TABS: 875.0000 mg | ORAL_TABLET | Freq: Two times a day (BID) | ORAL | Status: DC

## 2015-09-03 NOTE — ED Provider Notes (Signed)
CSN: [id number]     Arrival date & time 09/03/15  0930 History   First MD Initiated Contact with Patient 09/03/15 1122     Chief Complaint  Patient presents with  . Sore Throat   (Consider location/radiation/quality/duration/timing/severity/associated sxs/prior Treatment) Patient is a 40 y.o. female presenting with URI. The history is provided by the patient.  URI Presenting symptoms: fever and sore throat   Presenting symptoms: no congestion, no cough, no ear pain, no facial pain and no rhinorrhea   Severity:  Moderate Onset quality:  Sudden Duration:  3 days Timing:  Constant Progression:  Worsening Chronicity:  New Relieved by:  Nothing Ineffective treatments:  OTC medications Associated symptoms: headaches   Associated symptoms: no arthralgias, no myalgias, no sinus pain, no sneezing and no wheezing     Past Medical History  Diagnosis Date  . Hemorrhoids   . Rectal bleeding 02/10/2013   History reviewed. No pertinent past surgical history. Family History  Problem Relation Age of Onset  . Breast cancer Maternal Aunt   . Cancer Maternal Aunt     Breast  . Cancer Father     lung  . Hyperlipidemia Father   . Hypertension Father   . Hyperlipidemia Mother   . Hypertension Mother   . Diabetes Maternal Uncle    Social History  Substance Use Topics  . Smoking status: Never Smoker   . Smokeless tobacco: Never Used  . Alcohol Use: 0.6 oz/week    1 Standard drinks or equivalent per week     Comment: occasional   OB History    Gravida Para Term Preterm AB TAB SAB Ectopic Multiple Living   2 2        2       Obstetric Comments   1st Menstrual Cycle: 16 1st Pregnancy:  18     Review of Systems  Constitutional: Positive for fever.  HENT: Positive for sore throat. Negative for congestion, ear pain, rhinorrhea and sneezing.   Respiratory: Negative for cough and wheezing.   Musculoskeletal: Negative for myalgias and arthralgias.  Neurological: Positive for headaches.     Allergies  Review of patient's allergies indicates no known allergies.  Home Medications   Prior to Admission medications   Medication Sig Start Date End Date Taking? Authorizing Provider  acetaminophen (TYLENOL) 500 MG tablet Take 500 mg by mouth every 6 (six) hours as needed.    Historical Provider, MD  amoxicillin (AMOXIL) 875 MG tablet Take 1 tablet (875 mg total) by mouth 2 (two) times daily. 09/03/15   Payton Mccallum, MD  azithromycin (ZITHROMAX Z-PAK) 250 MG tablet 2 pills today then 1 pill a day for 4 days 08/21/15   Faythe Ghee, PA-C  BIOTIN PO Take 1 capsule by mouth daily.    Historical Provider, MD  fluconazole (DIFLUCAN) 150 MG tablet Take 1 tablet (150 mg total) by mouth once. 08/21/15   Faythe Ghee, PA-C  methylPREDNISolone (MEDROL DOSEPAK) 4 MG TBPK tablet Take 6 pills on day one then decrease by 1 pill each day 08/21/15   Faythe Ghee, PA-C  Multiple Vitamin (MULTIVITAMIN) tablet Take 1 tablet by mouth daily.    Historical Provider, MD  OVER THE COUNTER MEDICATION Take 1 tablet by mouth daily. Keratin    Historical Provider, MD  phentermine 37.5 MG capsule Take 37.5 mg by mouth every morning.    Historical Provider, MD  topiramate (TOPAMAX) 50 MG tablet Take 50 mg by mouth 2 (two) times daily. Reported on  08/21/2015    Historical Provider, MD  vitamin B-12 (CYANOCOBALAMIN) 1000 MCG tablet Take 1,000 mcg by mouth daily. Reported on 08/21/2015    Historical Provider, MD   Meds Ordered and Administered this Visit  Medications - No data to display  BP 115/74 mmHg  Pulse 78  Temp(Src) 98 F (36.7 C) (Oral)  Resp 16  Ht 5\' 4"  (1.626 m)  Wt 165 lb (74.844 kg)  BMI 28.31 kg/m2  SpO2 100%  LMP 08/31/2015 (Approximate) No data found.   Physical Exam  Constitutional: She appears well-developed and well-nourished. No distress.  HENT:  Head: Normocephalic.  Right Ear: Tympanic membrane, external ear and ear canal normal.  Left Ear: Tympanic membrane, external  ear and ear canal normal.  Nose: Nose normal.  Mouth/Throat: Uvula is midline and mucous membranes are normal. Oropharyngeal exudate and posterior oropharyngeal erythema present. No posterior oropharyngeal edema or tonsillar abscesses.  Eyes: Conjunctivae and EOM are normal. Pupils are equal, round, and reactive to light. Right eye exhibits no discharge. Left eye exhibits no discharge. No scleral icterus.  Neck: Normal range of motion. Neck supple. No JVD present. No tracheal deviation present. No thyromegaly present.  Cardiovascular: Normal rate, regular rhythm, normal heart sounds and intact distal pulses.   No murmur heard. Pulmonary/Chest: Effort normal and breath sounds normal. No stridor. No respiratory distress. She has no wheezes. She has no rales.  Lymphadenopathy:    She has no cervical adenopathy.  Skin: No rash noted. She is not diaphoretic.  Vitals reviewed.   ED Course  Procedures (including critical care time)  Labs Review Labs Reviewed  RAPID STREP SCREEN (NOT AT [facility])  CULTURE, GROUP A STREP ([facility] ONLY)    Imaging Review No results found.   Visual Acuity Review  Right Eye Distance:   Left Eye Distance:   Bilateral Distance:    Right Eye Near:   Left Eye Near:    Bilateral Near:         MDM   1. Pharyngitis   2. Tonsillitis    Discharge Medication List as of 09/03/2015 11:57 AM    START taking these medications   Details  amoxicillin (AMOXIL) 875 MG tablet Take 1 tablet (875 mg total) by mouth 2 (two) times daily., Starting 09/03/2015, Until Discontinued, Normal       1. Lab results and diagnosis reviewed with patient 2. rx as per orders above; reviewed possible side effects, interactions, risks and benefits  3. Recommend supportive treatment with otc analgesics, rest, increased fluids, salt/water gargles 4. Follow-up prn if symptoms worsen or don't improve    Payton Mccallumrlando Lonetta Blassingame, MD 09/26/15 1106

## 2015-09-03 NOTE — ED Notes (Signed)
Patient c/o sore throat that started 3 days ago.  Patient states that she was treated for Tonsilitis 2 weeks ago.  Patient denies fevers.

## 2015-09-05 ENCOUNTER — Telehealth: Payer: Self-pay | Admitting: *Deleted

## 2015-09-05 LAB — CULTURE, GROUP A STREP (THRC)

## 2015-09-05 NOTE — ED Notes (Signed)
Called patient and informed her that the strep culture lab came back negative.  Patient confirmed understanding of information.

## 2015-09-12 DIAGNOSIS — J351 Hypertrophy of tonsils: Secondary | ICD-10-CM | POA: Diagnosis not present

## 2015-09-21 DIAGNOSIS — J3501 Chronic tonsillitis: Secondary | ICD-10-CM | POA: Diagnosis not present

## 2015-10-10 ENCOUNTER — Encounter: Payer: Self-pay | Admitting: *Deleted

## 2015-10-17 ENCOUNTER — Encounter
Admission: RE | Disposition: A | Discharge status: Home / Self Care | Payer: Self-pay | Source: Ambulatory Visit | Attending: Otolaryngology

## 2015-10-17 ENCOUNTER — Ambulatory Visit
Admission: RE | Admit: 2015-10-17 | Discharge: 2015-10-17 | Disposition: A | Discharge status: Home / Self Care | Payer: 59 | Source: Ambulatory Visit | Attending: Otolaryngology | Admitting: Otolaryngology

## 2015-10-17 ENCOUNTER — Ambulatory Visit: Payer: 59 | Admitting: Anesthesiology

## 2015-10-17 DIAGNOSIS — J351 Hypertrophy of tonsils: Secondary | ICD-10-CM | POA: Diagnosis not present

## 2015-10-17 DIAGNOSIS — H6690 Otitis media, unspecified, unspecified ear: Secondary | ICD-10-CM | POA: Diagnosis not present

## 2015-10-17 DIAGNOSIS — J3501 Chronic tonsillitis: Secondary | ICD-10-CM | POA: Insufficient documentation

## 2015-10-17 DIAGNOSIS — H6990 Unspecified Eustachian tube disorder, unspecified ear: Secondary | ICD-10-CM | POA: Diagnosis not present

## 2015-10-17 HISTORY — PX: TONSILLECTOMY: SHX5217

## 2015-10-17 SURGERY — TONSILLECTOMY
Anesthesia: General | Laterality: Bilateral | Wound class: Clean Contaminated

## 2015-10-17 MED ORDER — LIDOCAINE HCL (CARDIAC) 20 MG/ML IV SOLN
INTRAVENOUS | Status: DC | PRN
Start: 2015-10-17 — End: 2015-10-17
  Administered 2015-10-17: 50 mg via INTRAVENOUS

## 2015-10-17 MED ORDER — PROPOFOL 10 MG/ML IV BOLUS
INTRAVENOUS | Status: DC | PRN
  Administered 2015-10-17: 150 mg via INTRAVENOUS

## 2015-10-17 MED ORDER — LACTATED RINGERS IV SOLN
INTRAVENOUS | Status: DC
  Administered 2015-10-17: 08:00:00 via INTRAVENOUS

## 2015-10-17 MED ORDER — LIDOCAINE VISCOUS 2 % MT SOLN: 10.0000 mL | Freq: Four times a day (QID) | OROMUCOSAL | Status: DC | PRN

## 2015-10-17 MED ORDER — ACETAMINOPHEN 10 MG/ML IV SOLN
1000.0000 mg | Freq: Once | INTRAVENOUS | Status: AC
  Administered 2015-10-17: 1000 mg via INTRAVENOUS

## 2015-10-17 MED ORDER — SUCCINYLCHOLINE CHLORIDE 20 MG/ML IJ SOLN
INTRAMUSCULAR | Status: DC | PRN
  Administered 2015-10-17: 100 mg via INTRAVENOUS

## 2015-10-17 MED ORDER — ONDANSETRON HCL 4 MG/2ML IJ SOLN
4.0000 mg | Freq: Once | INTRAMUSCULAR | Status: AC | PRN
  Administered 2015-10-17: 4 mg via INTRAVENOUS

## 2015-10-17 MED ORDER — SCOPOLAMINE 1 MG/3DAYS TD PT72
1.0000 | MEDICATED_PATCH | Freq: Once | TRANSDERMAL | Status: DC
  Administered 2015-10-17: 1.5 mg via TRANSDERMAL

## 2015-10-17 MED ORDER — BUPIVACAINE HCL (PF) 0.25 % IJ SOLN
INTRAMUSCULAR | Status: DC | PRN
  Administered 2015-10-17: 1 mL

## 2015-10-17 MED ORDER — MIDAZOLAM HCL 5 MG/5ML IJ SOLN
INTRAMUSCULAR | Status: DC | PRN
  Administered 2015-10-17: 2 mg via INTRAVENOUS

## 2015-10-17 MED ORDER — PROMETHAZINE HCL 12.5 MG PO TABS: 12.5000 mg | ORAL_TABLET | Freq: Four times a day (QID) | ORAL | Status: DC | PRN

## 2015-10-17 MED ORDER — OXYCODONE HCL 5 MG/5ML PO SOLN
5.0000 mg | Freq: Once | ORAL | Status: AC
  Administered 2015-10-17: 5 mg via ORAL

## 2015-10-17 MED ORDER — FENTANYL CITRATE (PF) 100 MCG/2ML IJ SOLN
25.0000 ug | INTRAMUSCULAR | Status: DC | PRN
  Administered 2015-10-17: 10 ug via INTRAVENOUS
  Administered 2015-10-17: 100 ug via INTRAVENOUS

## 2015-10-17 MED ORDER — DEXAMETHASONE SODIUM PHOSPHATE 4 MG/ML IJ SOLN
INTRAMUSCULAR | Status: DC | PRN
  Administered 2015-10-17: 8 mg via INTRAVENOUS

## 2015-10-17 MED ORDER — OXYCODONE HCL 5 MG/5ML PO SOLN: 10.0000 mg | ORAL | Status: DC | PRN

## 2015-10-17 SURGICAL SUPPLY — 17 items

## 2015-10-17 NOTE — Anesthesia Procedure Notes (Signed)
Procedure Name: Intubation Date/Time: 10/17/2015 8:49 AM Performed by: Andee Poles Pre-anesthesia Checklist: Patient identified, Emergency Drugs available, Suction available, Patient being monitored and Timeout performed Patient Re-evaluated:Patient Re-evaluated prior to inductionOxygen Delivery Method: Circle system utilized Preoxygenation: Pre-oxygenation with 100% oxygen Intubation Type: IV induction Ventilation: Mask ventilation without difficulty Laryngoscope Size: Mac and 3 Grade View: Grade I Tube type: Oral Rae Tube size: 7.0 mm Number of attempts: 1 Placement Confirmation: ETT inserted through vocal cords under direct vision,  positive ETCO2 and breath sounds checked- equal and bilateral Tube secured with: Tape Dental Injury: Teeth and Oropharynx as per pre-operative assessment

## 2015-10-17 NOTE — Discharge Instructions (Signed)
T & A INSTRUCTION SHEET - [facility] °[facility] ° °CREIGHTON VAUGHT, MD °PAUL H. JUENGEL, MD  °P. SCOTT BENNETT °CHAPMAN MCQUEEN, MD ° °[address] [place], [place] [address] TEL. [phone] °[address] [place] [address] [phone] ° °INFORMATION SHEET FOR A TONSILLECTOMY AND ADENDOIDECTOMY ° °About Your Tonsils and Adenoids ° The tonsils and adenoids are normal body tissues that are part of our immune system.  They normally help to protect us against diseases that may enter our mouth and nose.  However, sometimes the tonsils and/or adenoids become too large and obstruct our breathing, especially at night. °  ° If either of these things happen it helps to remove the tonsils and adenoids in order to become healthier. The operation to remove the tonsils and adenoids is called a tonsillectomy and adenoidectomy. ° °The Location of Your Tonsils and Adenoids ° The tonsils are located in the back of the throat on both side and sit in a cradle of muscles. The adenoids are located in the roof of the mouth, behind the nose, and closely associated with the opening of the Eustachian tube to the ear. ° °Surgery on Tonsils and Adenoids ° A tonsillectomy and adenoidectomy is a short operation which takes about thirty minutes.  This includes being put to sleep and being awakened.  Tonsillectomies and adenoidectomies are performed at [facility] and may require observation period in the recovery room prior to going home. ° °Following the Operation for a Tonsillectomy ° A cautery machine is used to control bleeding.  Bleeding from a tonsillectomy and adenoidectomy is minimal and postoperatively the risk of bleeding is approximately four percent, although this rarely life threatening. ° ° ° °After your tonsillectomy and adenoidectomy post-op care at home: ° °1. Our patients are able to go home the same day.  You may be given prescriptions for pain  medications and antibiotics, if indicated. °2. It is extremely important to remember that fluid intake is of utmost importance after a tonsillectomy.  The amount that you drink must be maintained in the postoperative period.  A good indication of whether a child is getting enough fluid is whether his/her urine output is constant.  As long as children are urinating or wetting their diaper every 6 - 8 hours this is usually enough fluid intake.   °3. Although rare, this is a risk of some bleeding in the first ten days after surgery.  This is usually occurs between day five and nine postoperatively.  This risk of bleeding is approximately four percent.  If you or your child should have any bleeding you should remain calm and notify our office or go directly to the Emergency Room at  [facility] where they will contact us. Our doctors are available seven days a week for notification.  We recommend sitting up quietly in a chair, place an ice pack on the front of the neck and spitting out the blood gently until we are able to contact you.  Adults should gargle gently with ice water and this may help stop the bleeding.  If the bleeding does not stop after a short time, i.e. 10 to 15 minutes, or seems to be increasing again, please contact us or go to the hospital.   °4. It is common for the pain to be worse at 5 - 7 days postoperatively.  This occurs because the “scab” is peeling off and the mucous membrane (skin of   the throat) is growing back where the tonsils were.   °5. It is common for a low-grade fever, less than 102, during the first week after a tonsillectomy and adenoidectomy.  It is usually due to not drinking enough liquids, and we suggest your use liquid Tylenol or the pain medicine with Tylenol prescribed in order to keep your temperature below 102.  Please follow the directions on the back of the bottle. °6. Do not take aspirin or any products that contain aspirin such as Bufferin, Anacin,  Ecotrin, aspirin gum, Goodies, BC headache powders, etc., after a T&A because it can promote bleeding.  Please check with our office before administering any other medication that may been prescribed by other doctors during the two week post-operative period. °7. If you happen to look in the mirror or into your child’s mouth you will see white/gray patches on the back of the throat.  This is what a scab looks like in the mouth and is normal after having a T&A.  It will disappear once the tonsil area heals completely. However, it may cause a noticeable odor, and this too will disappear with time.     °8. You or your child may experience ear pain after having a T&A.  This is called referred pain and comes from the throat, but it is felt in the ears.  Ear pain is quite common and expected.  It will usually go away after ten days.  There is usually nothing wrong with the ears, and it is primarily due to the healing area stimulating the nerve to the ear that runs along the side of the throat.  Use either the prescribed pain medicine or Tylenol as needed.  °9. The throat tissues after a tonsillectomy are obviously sensitive.  Smoking around children who have had a tonsillectomy significantly increases the risk of bleeding.  DO NOT SMOKE!  ° °General Anesthesia, Adult, Care After °Refer to this sheet in the next few weeks. These instructions provide you with information on caring for yourself after your procedure. Your health care provider may also give you more specific instructions. Your treatment has been planned according to current medical practices, but problems sometimes occur. Call your health care provider if you have any problems or questions after your procedure. °WHAT TO EXPECT AFTER THE PROCEDURE °After the procedure, it is typical to experience: °· Sleepiness. °· Nausea and vomiting. °HOME CARE INSTRUCTIONS °· For the first 24 hours after general anesthesia: °¨ Have a responsible person with you. °¨ Do not  drive a car. If you are alone, do not take public transportation. °¨ Do not drink alcohol. °¨ Do not take medicine that has not been prescribed by your health care provider. °¨ Do not sign important papers or make important decisions. °¨ You may resume a normal diet and activities as directed by your health care provider. °· Change bandages (dressings) as directed. °· If you have questions or problems that seem related to general anesthesia, call the hospital and ask for the anesthetist or anesthesiologist on call. °SEEK MEDICAL CARE IF: °· You have nausea and vomiting that continue the day after anesthesia. °· You develop a rash. °SEEK IMMEDIATE MEDICAL CARE IF:  °· You have difficulty breathing. °· You have chest pain. °· You have any allergic problems. °  °This information is not intended to replace advice given to you by your health care provider. Make sure you discuss any questions you have with your health care provider. °  °Document   Released: 11/24/2000 Document Revised: 09/08/2014 Document Reviewed: 12/17/2011 °Elsevier Interactive Patient Education ©2016 Elsevier Inc. ° °

## 2015-10-17 NOTE — Anesthesia Preprocedure Evaluation (Signed)
Anesthesia Evaluation  Patient identified by MRN, date of birth, ID band  Reviewed: Allergy & Precautions, H&P , NPO status , Patient's Chart, lab work & pertinent test results  Airway Mallampati: I  TM Distance: >3 FB Neck ROM: full    Dental no notable dental hx.    Pulmonary    Pulmonary exam normal        Cardiovascular  Rhythm:regular Rate:Normal     Neuro/Psych    GI/Hepatic   Endo/Other    Renal/GU      Musculoskeletal   Abdominal   Peds  Hematology   Anesthesia Other Findings   Reproductive/Obstetrics                             Anesthesia Physical Anesthesia Plan  ASA: I  Anesthesia Plan: General ETT   Post-op Pain Management:    Induction:   Airway Management Planned:   Additional Equipment:   Intra-op Plan:   Post-operative Plan:   Informed Consent: I have reviewed the patients History and Physical, chart, labs and discussed the procedure including the risks, benefits and alternatives for the proposed anesthesia with the patient or authorized representative who has indicated his/her understanding and acceptance.     Plan Discussed with: CRNA  Anesthesia Plan Comments:         Anesthesia Quick Evaluation  

## 2015-10-17 NOTE — Op Note (Signed)
..  10/17/2015  9:15 AM    Kelly Sparks  [id number]   Pre-Op Dx:  Chronic tonsillitis  Post-op Dx: same  Proc:Tonsillectomy > age 40  Surg: Kelly Sparks  Anes:  General Endotracheal  EBL:  10  Comp:  None  Findings:  Cryptic and erythematous tonsils with moderate fibrotic scaring to the underlying musculature  Procedure: After the patient was identified in holding and the history and physical and consent was reviewed, the patient was taken to the operating room and placed in a supine position.  General endotracheal anesthesia was induced in the normal fashion.  At this time, the patient was rotated 45 degrees and a shoulder roll was placed.  At this time, a McIvor mouthgag was inserted into the patient's oral cavity and suspended from the Mayo stand without injury to teeth, lips, or gums.  Next a red rubber catheter was inserted into the patient left nostril for retraction of the uvula and soft palate superiorly.  Next a curved Alice clamp was attached to the patient's right superior tonsillar pole and retracted medially and inferiorly.  A Bovie electrocautery was used to dissect the patient's right tonsil in a subcapsular plane.  Meticulous hemostasis was achieved with Bovie suction cautery.  At this time, the mouth gag was released from suspension for 1 minute.  Attention now was directed to the patient's left side.  In a similar fashion the curved Alice clamp was attached to the superior pole and this was retracted medially and inferiorly and the tonsil was excised in a subcapsular plane with Bovie electrocautery.  After completion of the second tonsil, meticulous hemostasis was continued.  At this time, the patient's nasal cavity and oral cavity was irrigated with sterile saline.  One cc of 0.25% Marcaine was injected into the anterior and posterior tonsillar fossa bilaterally.  Following this  The care of patient was returned to anesthesia, awakened, and transferred to  recovery in stable condition.  Dispo:  PACU to home  Plan: Soft diet.  Limit exercise and strenuous activity for 2 weeks.  Fluid hydration  Recheck my office three weeks.   Kelly Sparks 9:15 AM 10/17/2015

## 2015-10-17 NOTE — H&P (Signed)
..  History and Physical paper copy reviewed and updated date of procedure and will be scanned into system.  

## 2015-10-17 NOTE — Anesthesia Postprocedure Evaluation (Signed)
Anesthesia Post Note  Patient: Kelly Sparks  Procedure(s) Performed: Procedure(s) (LRB): TONSILLECTOMY (Bilateral)  Patient location during evaluation: PACU Anesthesia Type: General Level of consciousness: awake and alert and oriented Pain management: satisfactory to patient Vital Signs Assessment: post-procedure vital signs reviewed and stable Respiratory status: spontaneous breathing, nonlabored ventilation and respiratory function stable Cardiovascular status: blood pressure returned to baseline and stable Postop Assessment: Adequate PO intake and No signs of nausea or vomiting Anesthetic complications: no    Cherly Beach

## 2015-10-17 NOTE — Transfer of Care (Signed)
Immediate Anesthesia Transfer of Care Note  Patient: Kelly Sparks  Procedure(s) Performed: Procedure(s): TONSILLECTOMY (Bilateral)  Patient Location: PACU  Anesthesia Type: General ETT  Level of Consciousness: awake, alert  and patient cooperative  Airway and Oxygen Therapy: Patient Spontanous Breathing and Patient connected to supplemental oxygen  Post-op Assessment: Post-op Vital signs reviewed, Patient's Cardiovascular Status Stable, Respiratory Function Stable, Patent Airway and No signs of Nausea or vomiting  Post-op Vital Signs: Reviewed and stable  Complications: No apparent anesthesia complications

## 2015-10-18 ENCOUNTER — Encounter: Payer: Self-pay | Admitting: Otolaryngology

## 2015-10-19 LAB — SURGICAL PATHOLOGY

## 2016-02-26 DIAGNOSIS — B373 Candidiasis of vulva and vagina: Secondary | ICD-10-CM | POA: Diagnosis not present

## 2016-04-29 ENCOUNTER — Ambulatory Visit (INDEPENDENT_AMBULATORY_CARE_PROVIDER_SITE_OTHER): Payer: 59 | Admitting: Internal Medicine

## 2016-04-29 ENCOUNTER — Telehealth: Payer: Self-pay | Admitting: Internal Medicine

## 2016-04-29 ENCOUNTER — Encounter: Payer: Self-pay | Admitting: Internal Medicine

## 2016-04-29 VITALS — BP 116/78 | HR 55 | Temp 98.8°F | Ht 64.0 in | Wt 174.8 lb

## 2016-04-29 DIAGNOSIS — R748 Abnormal levels of other serum enzymes: Secondary | ICD-10-CM | POA: Diagnosis not present

## 2016-04-29 DIAGNOSIS — R7303 Prediabetes: Secondary | ICD-10-CM | POA: Insufficient documentation

## 2016-04-29 DIAGNOSIS — Z0000 Encounter for general adult medical examination without abnormal findings: Secondary | ICD-10-CM

## 2016-04-29 DIAGNOSIS — B001 Herpesviral vesicular dermatitis: Secondary | ICD-10-CM | POA: Insufficient documentation

## 2016-04-29 DIAGNOSIS — Z1239 Encounter for other screening for malignant neoplasm of breast: Secondary | ICD-10-CM

## 2016-04-29 LAB — COMPREHENSIVE METABOLIC PANEL
ALK PHOS: 50 U/L (ref 39–117)
ALT: 14 U/L (ref 0–35)
AST: 17 U/L (ref 0–37)
Albumin: 4.1 g/dL (ref 3.5–5.2)
BILIRUBIN TOTAL: 0.8 mg/dL (ref 0.2–1.2)
BUN: 12 mg/dL (ref 6–23)
CO2: 30 meq/L (ref 19–32)
Calcium: 8.8 mg/dL (ref 8.4–10.5)
Chloride: 104 mEq/L (ref 96–112)
Creatinine, Ser: 0.72 mg/dL (ref 0.40–1.20)
GFR: 115.28 mL/min (ref >60.00)
GLUCOSE: 87 mg/dL (ref 70–99)
Potassium: 4.3 mEq/L (ref 3.5–5.1)
SODIUM: 136 meq/L (ref 135–145)
TOTAL PROTEIN: 6.9 g/dL (ref 6.0–8.3)

## 2016-04-29 LAB — LIPID PANEL
CHOL/HDL RATIO: 2
Cholesterol: 169 mg/dL (ref 0–200)
HDL: 69.6 mg/dL (ref >39.00)
LDL Cholesterol: 93 mg/dL (ref 0–99)
NONHDL: 99.45
Triglycerides: 32 mg/dL (ref 0.0–149.0)
VLDL: 6.4 mg/dL (ref 0.0–40.0)

## 2016-04-29 LAB — CBC
HCT: 37.2 % (ref 36.0–46.0)
Hemoglobin: 12.7 g/dL (ref 12.0–15.0)
MCHC: 34.1 g/dL (ref 30.0–36.0)
MCV: 92.8 fl (ref 78.0–100.0)
Platelets: 262 10*3/uL (ref 150.0–400.0)
RBC: 4.01 Mil/uL (ref 3.87–5.11)
RDW: 14 % (ref 11.5–15.5)
WBC: 4.7 10*3/uL (ref 4.0–10.5)

## 2016-04-29 LAB — HEMOGLOBIN A1C: HEMOGLOBIN A1C: 5.7 % (ref 4.6–6.5)

## 2016-04-29 MED ORDER — VALACYCLOVIR HCL 500 MG PO TABS: 500.0000 mg | ORAL_TABLET | Freq: Two times a day (BID) | ORAL | 2 refills | Status: DC | PRN

## 2016-04-29 NOTE — Progress Notes (Signed)
Subjective:    Patient ID: Kelly Sparks, female    DOB: [birth year], 40 y.o.   MRN: [id number]  HPI  Pt presents to the clinic today for her annual exam. She understands that insurance may not pay for this visit, because it has not been 1 year and 1 day since her last annual exam.  Elevated liver enzymes: Her AST/ALT were elevated 05/04/2015. She has them repeated 05/12/2015, AST had returned to normal and ALT was only slightly elevated. She has not had an ultrasound of the liver done.  Prediabetes: Her last A1C was 5.8%. She initially lose 11 pounds from her last visit but then gained it all back. Her BMI today is 30.  She reports she occasionally has breakouts of cold sores. She used to have a RX for Valtrex to take as needed and would like a refill of that today.   Flu: 06/2015 Tetanus: 10/2011 Pap Smear: 05/2015 Mammogram: 2012 Vision Screening: as needed Dentist: biannually  Diet: She eats red meat and lean meats. She eats fruits and veggies daily. She does consume some fried. She drinks mostly water. Exercise: She walks most days for about 30 minutes.   Review of Systems      Past Medical History:  Diagnosis Date  . Hemorrhoids   . Rectal bleeding 02/10/2013    Current Outpatient Prescriptions  Medication Sig Dispense Refill  . BIOTIN PO Take 1 capsule by mouth daily.    . Multiple Vitamin (MULTIVITAMIN) tablet Take 1 tablet by mouth daily.    . Probiotic Product (PROBIOTIC-10) CAPS Take 1 tablet by mouth daily.     No current facility-administered medications for this visit.     No Known Allergies  Family History  Problem Relation Age of Onset  . Breast cancer Maternal Aunt   . Cancer Maternal Aunt     Breast  . Cancer Father     lung  . Hyperlipidemia Father   . Hypertension Father   . Hyperlipidemia Mother   . Hypertension Mother   . Diabetes Maternal Uncle     Social History   Social History  . Marital status: Married    Spouse name: N/A  .  Number of children: N/A  . Years of education: N/A   Occupational History  . Not on file.   Social History Main Topics  . Smoking status: Never Smoker  . Smokeless tobacco: Never Used  . Alcohol use 0.6 oz/week    1 Standard drinks or equivalent per week     Comment: occasional - 1 mixed drink/mo  . Drug use: No  . Sexual activity: Yes   Other Topics Concern  . Not on file   Social History Narrative  . No narrative on file     Constitutional: Denies fever, malaise, fatigue, headache or abrupt weight changes.  HEENT: Pt reports hair loss. Denies eye pain, eye redness, ear pain, ringing in the ears, wax buildup, runny nose, nasal congestion, bloody nose, or sore throat. Respiratory: Denies difficulty breathing, shortness of breath, cough or sputum production.   Cardiovascular: Denies chest pain, chest tightness, palpitations or swelling in the hands or feet.  Gastrointestinal: Pt reports occasional reflux and constipation. Denies abdominal pain, bloating, diarrhea or blood in the stool.  GU: Denies urgency, frequency, pain with urination, burning sensation, blood in urine, odor or discharge. Musculoskeletal: Denies decrease in range of motion, difficulty with gait, muscle pain or joint pain and swelling.  Skin: Denies redness, rashes, lesions or ulcercations.  Neurological: Denies dizziness, difficulty with memory, difficulty with speech or problems with balance and coordination.  Psych: Denies anxiety, depression, SI/HI.  No other specific complaints in a complete review of systems (except as listed in HPI above).  Objective:   Physical Exam  BP 116/78   Pulse (!) 55   Temp 98.8 F (37.1 C) (Oral)   Ht 5\' 4"  (1.626 m)   Wt 174 lb 12 oz (79.3 kg)   LMP 04/21/2016   SpO2 99%   BMI 30.00 kg/m  Wt Readings from Last 3 Encounters:  04/29/16 174 lb 12 oz (79.3 kg)  10/17/15 163 lb (73.9 kg)  09/03/15 165 lb (74.8 kg)    General: Appears her stated age, well developed,  well nourished in NAD. Skin: Warm, dry and intact.  HEENT: Head: normal shape and size; Eyes: sclera white, no icterus, conjunctiva pink, PERRLA and EOMs intact; Ears: Tm's gray and intact, normal light reflex; Throat/Mouth: Teeth present, mucosa pink and moist, no exudate, lesions or ulcerations noted.  Neck:  Neck supple, trachea midline. No masses, lumps or thyromegaly present.  Cardiovascular: Normal rate and rhythm. S1,S2 noted.  No murmur, rubs or gallops noted. No JVD or BLE edema.  Pulmonary/Chest: Normal effort and positive vesicular breath sounds. No respiratory distress. No wheezes, rales or ronchi noted.  Abdomen: Soft and nontender. Hypoactive bowel sounds. No distention or masses noted. Liver, spleen and kidneys non palpable. Musculoskeletal: Normal range of motion. No signs of joint swelling. Strength 5/5 BUE/BLE. No difficulty with gait.  Neurological: Alert and oriented. Cranial nerves II-XII grossly intact. Coordination normal.  Psychiatric: Mood and affect normal. Behavior is normal. Judgment and thought content normal.    BMET    Component Value Date/Time   NA 137 05/12/2015 1043   NA 136 12/24/2012 0845   K 4.4 05/12/2015 1043   K 3.8 12/24/2012 0845   CL 105 05/12/2015 1043   CL 104 12/24/2012 0845   CO2 24 05/12/2015 1043   CO2 26 12/24/2012 0845   GLUCOSE 88 05/12/2015 1043   GLUCOSE 85 12/24/2012 0845   BUN 9 05/12/2015 1043   BUN 10 12/24/2012 0845   CREATININE 0.77 05/12/2015 1043   CALCIUM 9.2 05/12/2015 1043   CALCIUM 8.7 12/24/2012 0845   GFRNONAA >89 05/12/2015 1043   GFRAA >89 05/12/2015 1043    Lipid Panel     Component Value Date/Time   CHOL 158 05/04/2015 1509   CHOL 182 12/24/2012 0845   TRIG 37 05/04/2015 1509   TRIG 33 12/24/2012 0845   HDL 65 05/04/2015 1509   HDL 65 (H) 12/24/2012 0845   CHOLHDL 2.4 05/04/2015 1509   VLDL 7 05/04/2015 1509   VLDL 7 12/24/2012 0845   LDLCALC 86 05/04/2015 1509   LDLCALC 110 (H) 12/24/2012 0845     CBC    Component Value Date/Time   WBC 5.7 05/04/2015 1509   RBC 3.98 05/04/2015 1509   HGB 12.4 05/04/2015 1509   HGB 12.3 12/24/2012 0845   HCT 36.5 05/04/2015 1509   HCT 36.3 12/24/2012 0845   PLT 278 05/04/2015 1509   PLT 248 12/24/2012 0845   MCV 91.7 05/04/2015 1509   MCV 92 12/24/2012 0845   MCH 31.2 05/04/2015 1509   MCHC 34.0 05/04/2015 1509   RDW 13.8 05/04/2015 1509   RDW 12.9 12/24/2012 0845   LYMPHSABS 1.8 12/24/2012 0845   MONOABS 0.3 12/24/2012 0845   EOSABS 0.1 12/24/2012 0845   BASOSABS 0.0 12/24/2012 0845  Hgb A1C Lab Results  Component Value Date   HGBA1C 5.8 (H) 05/04/2015            Assessment & Plan:   Preventative Health Maintenance:  She will get her flu shot in the fall at work Tetanus UTD Pap smear UTD Mammogram ordered- she will call [facility] to schedule, phone number provided Encouraged her to see an eye doctor and dentist annually Encouraged her to consume a balanced diet and exercise regimen Will check CBC, CMET, Lipid and A1C today  RTC in 1 year, sooner if needed

## 2016-04-29 NOTE — Telephone Encounter (Signed)
Okay with me if fine with PCP.

## 2016-04-29 NOTE — Patient Instructions (Signed)
Prediabetes Eating Plan Prediabetes--also called impaired glucose tolerance or impaired fasting glucose--is a condition that causes blood sugar (blood glucose) levels to be higher than normal. Following a healthy diet can help to keep prediabetes under control. It can also help to lower the risk of type 2 diabetes and heart disease, which are increased in people who have prediabetes. Along with regular exercise, a healthy diet:  Promotes weight loss.  Helps to control blood sugar levels.  Helps to improve the way that the body uses insulin. WHAT DO I NEED TO KNOW ABOUT THIS EATING PLAN?  Use the glycemic index (GI) to plan your meals. The index tells you how quickly a food will raise your blood sugar. Choose low-GI foods. These foods take a longer time to raise blood sugar.  Pay close attention to the amount of carbohydrates in the food that you eat. Carbohydrates increase blood sugar levels.  Keep track of how many calories you take in. Eating the right amount of calories will help you to achieve a healthy weight. Losing about 7 percent of your starting weight can help to prevent type 2 diabetes.  You may want to follow a Mediterranean diet. This diet includes a lot of vegetables, lean meats or fish, whole grains, fruits, and healthy oils and fats. WHAT FOODS CAN I EAT? Grains Whole grains, such as whole-wheat or whole-grain breads, crackers, cereals, and pasta. Unsweetened oatmeal. Bulgur. Barley. Quinoa. Brown rice. Corn or whole-wheat flour tortillas or taco shells. Vegetables Lettuce. Spinach. Peas. Beets. Cauliflower. Cabbage. Broccoli. Carrots. Tomatoes. Squash. Eggplant. Herbs. Peppers. Onions. Cucumbers. Brussels sprouts. Fruits Berries. Bananas. Apples. Oranges. Grapes. Papaya. Mango. Pomegranate. Kiwi. Grapefruit. Cherries. Meats and Other Protein Sources Seafood. Lean meats, such as chicken and turkey or lean cuts of pork and beef. Tofu. Eggs. Nuts. Beans. Dairy Low-fat or  fat-free dairy products, such as yogurt, cottage cheese, and cheese. Beverages Water. Tea. Coffee. Sugar-free or diet soda. Seltzer water. Milk. Milk alternatives, such as soy or almond milk. Condiments Mustard. Relish. Low-fat, low-sugar ketchup. Low-fat, low-sugar barbecue sauce. Low-fat or fat-free mayonnaise. Sweets and Desserts Sugar-free or low-fat pudding. Sugar-free or low-fat ice cream and other frozen treats. Fats and Oils Avocado. Walnuts. Olive oil. The items listed above may not be a complete list of recommended foods or beverages. Contact your dietitian for more options.  WHAT FOODS ARE NOT RECOMMENDED? Grains Refined white flour and flour products, such as bread, pasta, snack foods, and cereals. Beverages Sweetened drinks, such as sweet iced tea and soda. Sweets and Desserts Baked goods, such as cake, cupcakes, pastries, cookies, and cheesecake. The items listed above may not be a complete list of foods and beverages to avoid. Contact your dietitian for more information.   This information is not intended to replace advice given to you by your health care provider. Make sure you discuss any questions you have with your health care provider.   Document Released: 01/02/2015 Document Reviewed: 01/02/2015 Elsevier Interactive Patient Education 2016 Elsevier Inc.  

## 2016-04-29 NOTE — Assessment & Plan Note (Signed)
Repeat CMET today If liver enzymes remain elevated, will consider ultrasound of liver

## 2016-04-29 NOTE — Progress Notes (Signed)
Pre visit review using our clinic review tool, if applicable. No additional management support is needed unless otherwise documented below in the visit note. 

## 2016-04-29 NOTE — Telephone Encounter (Signed)
Fine with me

## 2016-04-29 NOTE — Assessment & Plan Note (Signed)
eRx for Valtrex 500 mg BID prn

## 2016-04-29 NOTE — Assessment & Plan Note (Signed)
Repeat A1C today Handout given on diet for prediabetes

## 2016-04-29 NOTE — Telephone Encounter (Signed)
[facility]. [facility] but needs more flexible times- early mornings will help due to her job in [employer] field. Please advise if ok to schedule for cpe with Jae DireKate next year.

## 2016-05-02 NOTE — Telephone Encounter (Signed)
I called and left detailed msg explaining transfer of care was complete.

## 2016-05-02 NOTE — Telephone Encounter (Deleted)
I left detailed vm explaining transfer of care was complete and to call the office with any questions.

## 2016-05-07 ENCOUNTER — Telehealth: Payer: 59 | Admitting: Nurse Practitioner

## 2016-05-07 DIAGNOSIS — R110 Nausea: Secondary | ICD-10-CM

## 2016-05-07 NOTE — Progress Notes (Signed)
Based on what you shared with me it looks like you have a serious condition that should be evaluated in a face to face office visit. This has been going on for 2 long to [employer] an e visit. I am so sorry.  If you are having a true medical emergency please call 911.  If you need an urgent face to face visit, [facility] has four urgent care centers for your convenience.  If you need care fast and have a high deductible or no insurance consider:   [email or web address]  [phone]  [address], [address] [place], [place] [address] 8 am to 8 pm Monday-Friday 10 am to 4 pm Saturday-Sunday   The following sites will take your  insurance:    . [facility]  [phone] Get Driving Directions Find a Provider at this Location  [address] [place], [place] [address] . 10 am to 8 pm Monday-Friday . 12 pm to 8 pm Saturday-Sunday   . [facility]  [phone] Get Driving Directions Find a Provider at this Location  [address] [place], [place] [address] . 8 am to 8 pm Monday-Friday . 9 am to 6 pm Saturday . 11 am to 6 pm Sunday   . [facility] [place]  [phone] Get Driving Directions  [address] [place], [place] [address] . 8 am to 8 pm Monday-Friday . 8 am to 4 pm Saturday-Sunday   . [facility] (a walk in primary care provider)  [phone]  Get Driving Directions Find a Provider at this Location  [address] [place], [place] [address] . 8 am to 8:30 pm Monday-Thursday . 8 am to 6 pm Friday . 8 am to 4 pm Saturday-Sunday   Your e-visit answers were reviewed by a board certified advanced clinical practitioner to complete your personal care plan.  Thank you for using e-Visits.

## 2016-05-08 ENCOUNTER — Encounter: Payer: Self-pay | Admitting: Family Medicine

## 2016-05-08 ENCOUNTER — Ambulatory Visit (INDEPENDENT_AMBULATORY_CARE_PROVIDER_SITE_OTHER): Payer: 59 | Admitting: Family Medicine

## 2016-05-08 VITALS — BP 102/70 | HR 77 | Temp 98.2°F | Ht 65.0 in | Wt 171.1 lb

## 2016-05-08 DIAGNOSIS — R110 Nausea: Secondary | ICD-10-CM

## 2016-05-08 MED ORDER — ONDANSETRON HCL 4 MG PO TABS: 4.0000 mg | ORAL_TABLET | Freq: Three times a day (TID) | ORAL | 0 refills | Status: DC | PRN

## 2016-05-08 NOTE — Progress Notes (Signed)
Pre visit review using our clinic review tool, if applicable. No additional management support is needed unless otherwise documented below in the visit note. 

## 2016-05-08 NOTE — Progress Notes (Signed)
Subjective:    Patient ID: Kelly Sparks, female    DOB: 12/08/1975, 40 y.o.   MRN: [id number]  HPI This is a very pleasant 40 yo female who presents today with 2 months of nausea which has gotten more frequent over the last 2 weeks. Bowels are regular. Did have some pink in stool and some specks of blood x 1. Has had work up for hemorrhoids/rectal bleeding and work up was negative. Can not associate nausea with eating/not eating. Nausea high in stomach. Has tried omeprazole continuously for about 3 weeks without relief. No relief with antacids. Never vomits. Feels bloated. Has rare heartburn. No fever/chills, no fatigue, no lower abdominal pain, no pelvic pressure, no dysuria, no frequency, no hematuria. No vaginal discharge. Has some promethazine at home- makes her too sleepy to take and work. Her mother had similar symptoms with gallbladder disease. Had annual exam 8/17. Labs from 04/29/16 reviewed and normal (CBC, CMET).   Husband had vasectomy, periods regular, LMP 04/21/16. [place] in [place].   Past Medical History:  Diagnosis Date  . Hemorrhoids   . Rectal bleeding 02/10/2013   Past Surgical History:  Procedure Laterality Date  . NO PAST SURGERIES    . TONSILLECTOMY Bilateral 10/17/2015   Procedure: TONSILLECTOMY;  Surgeon: Bud Facereighton Vaught, MD;  [facility];  Service: ENT;  Laterality: Bilateral;   Family History  Problem Relation Age of Onset  . Breast cancer Maternal Aunt   . Cancer Maternal Aunt     Breast  . Cancer Father     lung  . Hyperlipidemia Father   . Hypertension Father   . Hyperlipidemia Mother   . Hypertension Mother   . Diabetes Maternal Uncle    Social History  Substance Use Topics  . Smoking status: Never Smoker  . Smokeless tobacco: Never Used  . Alcohol use 0.6 oz/week    1 Standard drinks or equivalent per week     Comment: occasional - 1 mixed drink/mo      Review of Systems Per HPI    Objective:     Physical Exam  Constitutional: She is oriented to person, place, and time. She appears well-developed and well-nourished. No distress.  HENT:  Head: Normocephalic and atraumatic.  Eyes: Conjunctivae are normal.  Cardiovascular: Normal rate, regular rhythm and normal heart sounds.   Pulmonary/Chest: Effort normal and breath sounds normal.  Abdominal: Soft. Bowel sounds are normal. There is no hepatosplenomegaly. There is tenderness in the epigastric area. There is no rigidity, no rebound, no guarding, no tenderness at McBurney's point and negative Murphy's sign.  Musculoskeletal: Normal range of motion.  Neurological: She is alert and oriented to person, place, and time.  Skin: Skin is warm and dry. She is not diaphoretic.  Psychiatric: She has a normal mood and affect. Her behavior is normal. Judgment and thought content normal.  Vitals reviewed.     BP 102/70   Pulse 77   Temp 98.2 F (36.8 C) (Oral)   Ht 5\' 5"  (1.651 m)   Wt 171 lb 1.9 oz (77.6 kg)   LMP 04/21/2016   SpO2 98%   BMI 28.48 kg/m  Wt Readings from Last 3 Encounters:  05/08/16 171 lb 1.9 oz (77.6 kg)  04/29/16 174 lb 12 oz (79.3 kg)  10/17/15 163 lb (73.9 kg)       Assessment & Plan:  1. Nausea - discussed possible causes with patient, will r/o gallbladder and h. Pylori. She has  had a good trial of PPI without relief.  - US Abdomen Limited RUQ; Future - H. pylori antigen, stool- stop omeprazole for 7-10 days before collecting - ondansetron (ZOFRAN) 4 MG tablet; Take 1 tablet (4 mg total) by mouth every 8 (eight) hours as needed for nausea or vomiting.  Dispense: 20 tablet; Refill: 0 - RTC precautions reviewed  Olean Ree, FNP-BC  [facility], [facility]  05/08/2016 9:11 AM

## 2016-05-09 ENCOUNTER — Ambulatory Visit
Admission: RE | Admit: 2016-05-09 | Discharge: 2016-05-09 | Disposition: A | Discharge status: Home / Self Care | Payer: 59 | Source: Ambulatory Visit | Attending: Family Medicine | Admitting: Family Medicine

## 2016-05-09 DIAGNOSIS — R110 Nausea: Secondary | ICD-10-CM | POA: Insufficient documentation

## 2016-05-14 ENCOUNTER — Ambulatory Visit: Payer: 59

## 2016-05-26 ENCOUNTER — Other Ambulatory Visit: Payer: Self-pay | Admitting: Internal Medicine

## 2016-05-26 ENCOUNTER — Ambulatory Visit
Admission: RE | Admit: 2016-05-26 | Discharge: 2016-05-26 | Disposition: A | Discharge status: Home / Self Care | Payer: 59 | Source: Ambulatory Visit | Attending: Internal Medicine | Admitting: Internal Medicine

## 2016-05-26 DIAGNOSIS — Z1239 Encounter for other screening for malignant neoplasm of breast: Secondary | ICD-10-CM | POA: Diagnosis not present

## 2016-05-26 DIAGNOSIS — Z1231 Encounter for screening mammogram for malignant neoplasm of breast: Secondary | ICD-10-CM | POA: Insufficient documentation

## 2016-05-26 LAB — H. PYLORI ANTIGEN, STOOL: H pylori Ag, Stl: NEGATIVE

## 2016-09-04 DIAGNOSIS — L669 Cicatricial alopecia, unspecified: Secondary | ICD-10-CM | POA: Diagnosis not present

## 2016-09-04 DIAGNOSIS — L089 Local infection of the skin and subcutaneous tissue, unspecified: Secondary | ICD-10-CM | POA: Diagnosis not present

## 2016-10-02 ENCOUNTER — Telehealth: Payer: 59 | Admitting: Nurse Practitioner

## 2016-10-02 DIAGNOSIS — J069 Acute upper respiratory infection, unspecified: Secondary | ICD-10-CM | POA: Diagnosis not present

## 2016-10-02 MED ORDER — PREDNISONE 5 MG PO TABS: 5.0000 mg | ORAL_TABLET | Freq: Every day | ORAL | 0 refills | Status: AC

## 2016-10-02 MED ORDER — BENZONATATE 100 MG PO CAPS: 100.0000 mg | ORAL_CAPSULE | Freq: Three times a day (TID) | ORAL | 0 refills | Status: AC | PRN

## 2016-10-02 NOTE — Progress Notes (Signed)
We are sorry that you are not feeling well.  Here is how we plan to help!  Based on what you have shared with me it looks like you have upper respiratory tract inflammation that has resulted in a significant cough.  Inflammation and infection in the upper respiratory tract is commonly called bronchitis and has four common causes:  Allergies, Viral Infections, Acid Reflux and Bacterial Infections.  Allergies, viruses and acid reflux are treated by controlling symptoms or eliminating the cause. An example might be a cough caused by taking certain blood pressure medications. You stop the cough by changing the medication. Another example might be a cough caused by acid reflux. Controlling the reflux helps control the cough.  Based on your presentation I believe you most likely have A cough due to a virus.  This is called viral bronchitis and is best treated by rest, plenty of fluids and control of the cough.  You may use Ibuprofen or Tylenol as directed to help your symptoms.     In addition you may use A prescription cough medication called Tessalon Perles 100mg . You may take 1-2 capsules every 8 hours as needed for your cough.  Sterapred 5 mg dosepak    HOME CARE . Only take medications as instructed by your medical team. . Complete the entire course of an antibiotic. . Drink plenty of fluids and get plenty of rest. . Avoid close contacts especially the very young and the elderly . Cover your mouth if you cough or cough into your sleeve. . Always remember to wash your hands . A steam or ultrasonic humidifier can help congestion.   GET HELP RIGHT AWAY IF: . You develop worsening fever. . You become short of breath . You cough up blood. . Your symptoms persist after you have completed your treatment plan MAKE SURE YOU   Understand these instructions.  Will watch your condition.  Will get help right away if you are not doing well or get worse.  Your e-visit answers were reviewed by a  board certified advanced clinical practitioner to complete your personal care plan.  Depending on the condition, your plan could have included both over the counter or prescription medications. If there is a problem please reply  once you have received a response from your provider. Your safety is important to us.  If you have drug allergies check your prescription carefully.    You can use MyChart to ask questions about today's visit, request a non-urgent call back, or ask for a work or school excuse for 24 hours related to this e-Visit. If it has been greater than 24 hours you will need to follow up with your provider, or enter a new e-Visit to address those concerns. You will get an e-mail in the next two days asking about your experience.  I hope that your e-visit has been valuable and will speed your recovery. Thank you for using e-visits.

## 2016-10-08 ENCOUNTER — Telehealth: Payer: 59 | Admitting: Nurse Practitioner

## 2016-10-08 DIAGNOSIS — J0100 Acute maxillary sinusitis, unspecified: Secondary | ICD-10-CM | POA: Diagnosis not present

## 2016-10-08 MED ORDER — AMOXICILLIN-POT CLAVULANATE 875-125 MG PO TABS: 1.0000 | ORAL_TABLET | Freq: Two times a day (BID) | ORAL | 0 refills | Status: DC

## 2016-10-08 NOTE — Progress Notes (Signed)

## 2017-01-15 DIAGNOSIS — L089 Local infection of the skin and subcutaneous tissue, unspecified: Secondary | ICD-10-CM | POA: Diagnosis not present

## 2017-01-15 DIAGNOSIS — L669 Cicatricial alopecia, unspecified: Secondary | ICD-10-CM | POA: Diagnosis not present

## 2017-01-16 DIAGNOSIS — L089 Local infection of the skin and subcutaneous tissue, unspecified: Secondary | ICD-10-CM | POA: Diagnosis not present

## 2017-01-16 DIAGNOSIS — L669 Cicatricial alopecia, unspecified: Secondary | ICD-10-CM | POA: Diagnosis not present

## 2017-03-18 ENCOUNTER — Ambulatory Visit (INDEPENDENT_AMBULATORY_CARE_PROVIDER_SITE_OTHER): Payer: 59 | Admitting: Primary Care

## 2017-03-18 ENCOUNTER — Encounter: Payer: Self-pay | Admitting: Primary Care

## 2017-03-18 VITALS — BP 122/74 | HR 58 | Temp 97.8°F | Ht 65.0 in | Wt 185.8 lb

## 2017-03-18 DIAGNOSIS — F411 Generalized anxiety disorder: Secondary | ICD-10-CM | POA: Insufficient documentation

## 2017-03-18 MED ORDER — FLUOXETINE HCL 20 MG PO TABS: 20.0000 mg | ORAL_TABLET | Freq: Every day | ORAL | 0 refills | Status: DC

## 2017-03-18 NOTE — Assessment & Plan Note (Signed)
Mild anxiety over the years, increased anxiety since early 2018, worse over the last 4-5 months. GAD 7 score of 16 today. Given that she's tried therapy in the past without much success, will treat with medication.   Rx for fluoxetine 20 mg tablets sent to pharmacy. Patient is to take 1/2 tablet daily for 8 days, then advance to 1 full tablet thereafter. We discussed possible side effects of headache, GI upset, drowsiness, and SI/HI. If thoughts of SI/HI develop, we discussed to present to the emergency immediately. Patient verbalized understanding.   Follow up in 6 weeks for re-evaluation.

## 2017-03-18 NOTE — Patient Instructions (Signed)
Start fluoxetine 20 mg tablets for anxiety. Start by taking 1/2 tablet daily for 8 days, then increase to 1 full tablet thereafter.  Schedule a follow up visit with Rene Kocheregina in 4-6 weeks for re-evaluation.  It was a pleasure meeting you!

## 2017-03-18 NOTE — Progress Notes (Signed)
Subjective:    Patient ID: Kelly Sparks, female    DOB: February 27, 1976, 41 y.o.   MRN: [id number]  HPI  [name] is a 41 year old female who presents today with a chief complaint of anxiety.   She's experienced a lot of personal stress over the last 6+ months including passing of several family members, feeling worried about her son who is in college, recently switched jobs and doesn't think this was a good choice, daughter just moved to another city. Her husband thinks she fixates too much on her son in college. History of anxiety for years, worse over the past 4-5 months.  She reports daily worry, mostly about her college son; daily anxiety; daily irritability. She's attended counseling in early 2018 without much improvement, but at that time didn't realize she had a problem.    Review of Systems  Constitutional: Positive for fatigue.  Respiratory: Negative for shortness of breath.   Cardiovascular: Negative for chest pain.  Genitourinary:       Irregular menstrual cycles  Skin:       Hair loss  Psychiatric/Behavioral:       See HPI       Past Medical History:  Diagnosis Date  . Hemorrhoids   . Rectal bleeding 02/10/2013     Social History   Social History  . Marital status: Married    Spouse name: N/A  . Number of children: N/A  . Years of education: N/A   Occupational History  . Not on file.   Social History Main Topics  . Smoking status: Never Smoker  . Smokeless tobacco: Never Used  . Alcohol use 0.6 oz/week    1 Standard drinks or equivalent per week     Comment: occasional - 1 mixed drink/mo  . Drug use: No  . Sexual activity: Yes   Other Topics Concern  . Not on file   Social History Narrative  . No narrative on file    Past Surgical History:  Procedure Laterality Date  . NO PAST SURGERIES    . TONSILLECTOMY Bilateral 10/17/2015   Procedure: TONSILLECTOMY;  Surgeon: Bud Facereighton Vaught, MD;  [facility];  Service: ENT;   Laterality: Bilateral;    Family History  Problem Relation Age of Onset  . Breast cancer Maternal Aunt   . Cancer Maternal Aunt        Breast  . Cancer Father        lung  . Hyperlipidemia Father   . Hypertension Father   . Hyperlipidemia Mother   . Hypertension Mother   . Diabetes Maternal Uncle     No Known Allergies  Current Outpatient Prescriptions on File Prior to Visit  Medication Sig Dispense Refill  . BIOTIN PO Take 1 capsule by mouth daily.    . Multiple Vitamin (MULTIVITAMIN) tablet Take 1 tablet by mouth daily.    . Probiotic Product (PROBIOTIC-10) CAPS Take 1 tablet by mouth daily.    . valACYclovir (VALTREX) 500 MG tablet Take 1 tablet (500 mg total) by mouth 2 (two) times daily as needed. 30 tablet 2   No current facility-administered medications on file prior to visit.     BP 122/74   Pulse (!) 58   Temp 97.8 F (36.6 C) (Oral)   Ht 5\' 5"  (1.651 m)   Wt 185 lb 12.8 oz (84.3 kg)   LMP 03/15/2017   SpO2 99%   BMI 30.92 kg/m    Objective:   Physical  Exam  Constitutional: She appears well-nourished.  Neck: Neck supple.  Cardiovascular: Normal rate and regular rhythm.   Pulmonary/Chest: Effort normal and breath sounds normal.  Skin: Skin is warm and dry.  Psychiatric: She has a normal mood and affect.          Assessment & Plan:

## 2017-04-13 ENCOUNTER — Encounter: Payer: Self-pay | Admitting: Primary Care

## 2017-04-13 DIAGNOSIS — F411 Generalized anxiety disorder: Secondary | ICD-10-CM

## 2017-04-14 MED ORDER — FLUOXETINE HCL 10 MG PO CAPS: 10.0000 mg | ORAL_CAPSULE | Freq: Every day | ORAL | 0 refills | Status: DC

## 2017-04-16 DIAGNOSIS — L089 Local infection of the skin and subcutaneous tissue, unspecified: Secondary | ICD-10-CM | POA: Diagnosis not present

## 2017-04-16 DIAGNOSIS — L669 Cicatricial alopecia, unspecified: Secondary | ICD-10-CM | POA: Diagnosis not present

## 2017-04-29 ENCOUNTER — Other Ambulatory Visit: Payer: Self-pay | Admitting: Primary Care

## 2017-04-29 ENCOUNTER — Ambulatory Visit (INDEPENDENT_AMBULATORY_CARE_PROVIDER_SITE_OTHER): Payer: 59 | Admitting: Obstetrics and Gynecology

## 2017-04-29 ENCOUNTER — Encounter: Payer: Self-pay | Admitting: Obstetrics and Gynecology

## 2017-04-29 ENCOUNTER — Other Ambulatory Visit (INDEPENDENT_AMBULATORY_CARE_PROVIDER_SITE_OTHER): Payer: 59

## 2017-04-29 VITALS — BP 122/70 | Ht 64.0 in | Wt 186.0 lb

## 2017-04-29 DIAGNOSIS — Z803 Family history of malignant neoplasm of breast: Secondary | ICD-10-CM

## 2017-04-29 DIAGNOSIS — Z1151 Encounter for screening for human papillomavirus (HPV): Secondary | ICD-10-CM

## 2017-04-29 DIAGNOSIS — Z8049 Family history of malignant neoplasm of other genital organs: Secondary | ICD-10-CM | POA: Diagnosis not present

## 2017-04-29 DIAGNOSIS — N938 Other specified abnormal uterine and vaginal bleeding: Secondary | ICD-10-CM | POA: Diagnosis not present

## 2017-04-29 DIAGNOSIS — Z801 Family history of malignant neoplasm of trachea, bronchus and lung: Secondary | ICD-10-CM | POA: Diagnosis not present

## 2017-04-29 DIAGNOSIS — Z1379 Encounter for other screening for genetic and chromosomal anomalies: Secondary | ICD-10-CM | POA: Insufficient documentation

## 2017-04-29 DIAGNOSIS — R7303 Prediabetes: Secondary | ICD-10-CM

## 2017-04-29 DIAGNOSIS — Z124 Encounter for screening for malignant neoplasm of cervix: Secondary | ICD-10-CM

## 2017-04-29 DIAGNOSIS — Z0000 Encounter for general adult medical examination without abnormal findings: Secondary | ICD-10-CM

## 2017-04-29 HISTORY — DX: Other specified abnormal uterine and vaginal bleeding: N93.8

## 2017-04-29 NOTE — Progress Notes (Addendum)
Chief Complaint  Patient presents with  . Menstrual Problem    HPI:      [name] is a 41 y.o. G2P2 who LMP was Patient's last menstrual period was 04/26/2017., presents today for DUB sx. Menses used to be monthly, lasting 6 days, med flow. They have now been Q2- 2 1/2 wks for the past 3 months. Pt initially thought they were related to stress, but sx have persisted. Flow is still medium and lasts 6 days, but now more frequent. No dysmen. Pt is sex active, husband with vasectomy. No new partners. Pt denies bleeding with sex.   She has also noted extreme vaginal dryness for the past 3 months. She uses lubricants but vag irritation after sex lasts a couple wks. No new soaps/detergents. No unusual vag d/c, odor.   Last pap 12/23/12 neg/ HPV DNA neg. She has labs with PCP but no recent thyroid check.   She is also interested in BRCA testing due to [place] breast cancer. Last mammo 05/26/16 was cat 1.    Past Medical History:  Diagnosis Date  . Hemorrhoids   . Rectal bleeding 02/10/2013    Past Surgical History:  Procedure Laterality Date  . NO PAST SURGERIES    . TONSILLECTOMY Bilateral 10/17/2015   Procedure: TONSILLECTOMY;  Surgeon: Carloyn Manner, MD;  Location: [facility];  Service: ENT;  Laterality: Bilateral;    Family History  Problem Relation Age of Onset  . Breast cancer Maternal Aunt 30  . Uterine cancer Maternal Aunt   . Cancer Father        lung  . Hyperlipidemia Father   . Hypertension Father   . Hyperlipidemia Mother   . Hypertension Mother   . Diabetes Maternal Uncle   . Breast cancer Paternal Aunt 58    Social History   Social History  . Marital status: Married    Spouse name: N/A  . Number of children: N/A  . Years of education: N/A   Occupational History  . Not on file.   Social History Main Topics  . Smoking status: Never Smoker  . Smokeless tobacco: Never Used  . Alcohol use 0.6 oz/week    1 Standard drinks or equivalent per  week     Comment: occasional - 1 mixed drink/mo  . Drug use: No  . Sexual activity: Yes   Other Topics Concern  . Not on file   Social History Narrative  . No narrative on file     Current Outpatient Prescriptions:  .  BIOTIN PO, Take 1 capsule by mouth daily., Disp: , Rfl:  .  FLUoxetine (PROZAC) 10 MG capsule, Take 1 capsule (10 mg total) by mouth daily., Disp: 30 capsule, Rfl: 0 .  Multiple Vitamin (MULTIVITAMIN) tablet, Take 1 tablet by mouth daily., Disp: , Rfl:  .  Probiotic Product (PROBIOTIC-10) CAPS, Take 1 tablet by mouth daily., Disp: , Rfl:  .  valACYclovir (VALTREX) 500 MG tablet, Take 1 tablet (500 mg total) by mouth 2 (two) times daily as needed., Disp: 30 tablet, Rfl: 2   ROS:  Review of Systems  Constitutional: Negative for fever.  Gastrointestinal: Negative for blood in stool, constipation, diarrhea, nausea and vomiting.  Genitourinary: Positive for dyspareunia, menstrual problem and vaginal pain. Negative for dysuria, flank pain, frequency, hematuria, urgency, vaginal bleeding and vaginal discharge.  Musculoskeletal: Negative for back pain.  Skin: Negative for rash.     OBJECTIVE:   Vitals:  BP 122/70   Ht  5' 4"  (1.626 m)   Wt 186 lb (84.4 kg)   LMP 04/26/2017   BMI 31.93 kg/m   Physical Exam  Constitutional: She is oriented to person, place, and time and well-developed, well-nourished, and in no distress. Vital signs are normal.  Genitourinary: Vagina normal, uterus normal, cervix normal, right adnexa normal, left adnexa normal and vulva normal. Uterus is not enlarged. Cervix exhibits no motion tenderness and no tenderness. Right adnexum displays no mass and no tenderness. Left adnexum displays no mass and no tenderness. Vulva exhibits no erythema, no exudate, no lesion, no rash and no tenderness. Vagina exhibits no lesion.  Neurological: She is oriented to person, place, and time.  Psychiatric: Memory, affect and judgment normal.  Vitals  reviewed.   Results: GYN U/S-->EM=7.28 MM; NO FF IN CDS; NO LEIO; BILAT OVAR WITH FOLLICLES  Assessment/Plan: DUB (dysfunctional uterine bleeding) - Check labs, neg GYN u/s. Will f/u with results and dispo. - Plan: TSH + free T4, Prolactin, US Transvaginal Non-OB  Family history of breast cancer - MyRisk testing discussed and done today. RTO 6 wks for results.  - Plan: Integrated BRACAnalysis  Genetic testing of female  Cervical cancer screening - Plan: IGP, Aptima HPV  Screening for HPV (human papillomavirus) - Plan: IGP, Aptima HPV    Return in about 6 weeks (around 06/10/2017) for [facility] results.  [name], PA-C 04/29/2017 4:53 PM

## 2017-04-29 NOTE — Addendum Note (Signed)
Addended by: Althea Grimmer B on: 04/29/2017 04:53 PM   Modules accepted: Orders

## 2017-04-30 LAB — TSH+FREE T4
Free T4: 1.21 ng/dL (ref 0.82–1.77)
TSH: 0.863 u[IU]/mL (ref 0.450–4.500)

## 2017-04-30 LAB — PROLACTIN: Prolactin: 31 ng/mL — ABNORMAL HIGH (ref 4.8–23.3)

## 2017-05-01 ENCOUNTER — Telehealth: Payer: Self-pay | Admitting: Obstetrics and Gynecology

## 2017-05-01 DIAGNOSIS — R899 Unspecified abnormal finding in specimens from other organs, systems and tissues: Secondary | ICD-10-CM

## 2017-05-01 NOTE — Telephone Encounter (Signed)
Pt aware of normal thyroid, elevated prolactin. Rechk prolactin fasting. Order in computer. Will f/u with resutls.

## 2017-05-02 DIAGNOSIS — Z1371 Encounter for nonprocreative screening for genetic disease carrier status: Secondary | ICD-10-CM

## 2017-05-02 DIAGNOSIS — Z803 Family history of malignant neoplasm of breast: Secondary | ICD-10-CM

## 2017-05-02 HISTORY — DX: Family history of malignant neoplasm of breast: Z80.3

## 2017-05-02 HISTORY — DX: Encounter for nonprocreative screening for genetic disease carrier status: Z13.71

## 2017-05-03 LAB — IGP, APTIMA HPV
HPV Aptima: NEGATIVE
PAP Smear Comment: 0

## 2017-05-05 ENCOUNTER — Other Ambulatory Visit: Payer: 59

## 2017-05-06 ENCOUNTER — Ambulatory Visit: Payer: 59 | Admitting: Obstetrics and Gynecology

## 2017-05-06 ENCOUNTER — Other Ambulatory Visit (INDEPENDENT_AMBULATORY_CARE_PROVIDER_SITE_OTHER): Payer: 59

## 2017-05-06 DIAGNOSIS — Z0000 Encounter for general adult medical examination without abnormal findings: Secondary | ICD-10-CM | POA: Diagnosis not present

## 2017-05-06 DIAGNOSIS — R899 Unspecified abnormal finding in specimens from other organs, systems and tissues: Secondary | ICD-10-CM

## 2017-05-06 DIAGNOSIS — R7303 Prediabetes: Secondary | ICD-10-CM | POA: Diagnosis not present

## 2017-05-06 LAB — COMPREHENSIVE METABOLIC PANEL
ALBUMIN: 4.1 g/dL (ref 3.5–5.2)
ALT: 11 U/L (ref 0–35)
AST: 14 U/L (ref 0–37)
Alkaline Phosphatase: 66 U/L (ref 39–117)
BUN: 16 mg/dL (ref 6–23)
CALCIUM: 9.4 mg/dL (ref 8.4–10.5)
CHLORIDE: 101 meq/L (ref 96–112)
CO2: 27 meq/L (ref 19–32)
CREATININE: 0.74 mg/dL (ref 0.40–1.20)
GFR: 111.12 mL/min (ref >60.00)
Glucose, Bld: 92 mg/dL (ref 70–99)
POTASSIUM: 4.3 meq/L (ref 3.5–5.1)
Sodium: 134 mEq/L — ABNORMAL LOW (ref 135–145)
Total Bilirubin: 0.6 mg/dL (ref 0.2–1.2)
Total Protein: 6.7 g/dL (ref 6.0–8.3)

## 2017-05-06 LAB — LIPID PANEL
CHOLESTEROL: 183 mg/dL (ref 0–200)
HDL: 62 mg/dL (ref >39.00)
LDL CALC: 113 mg/dL — AB (ref 0–99)
NonHDL: 120.89
Total CHOL/HDL Ratio: 3
Triglycerides: 40 mg/dL (ref 0.0–149.0)
VLDL: 8 mg/dL (ref 0.0–40.0)

## 2017-05-06 LAB — HEMOGLOBIN A1C: HEMOGLOBIN A1C: 5.9 % (ref 4.6–6.5)

## 2017-05-06 NOTE — Addendum Note (Signed)
Addended by: Alvina ChouWALSH, Rohn Fritsch J on: 05/06/2017 09:31 AM   Modules accepted: Orders

## 2017-05-07 LAB — PROLACTIN: PROLACTIN: 30.9 ng/mL — AB (ref 4.8–23.3)

## 2017-05-08 ENCOUNTER — Encounter: Payer: Self-pay | Admitting: Primary Care

## 2017-05-08 ENCOUNTER — Telehealth: Payer: Self-pay | Admitting: Obstetrics and Gynecology

## 2017-05-08 ENCOUNTER — Ambulatory Visit (INDEPENDENT_AMBULATORY_CARE_PROVIDER_SITE_OTHER): Payer: 59 | Admitting: Primary Care

## 2017-05-08 VITALS — BP 122/78 | HR 80 | Temp 98.2°F | Ht 64.0 in | Wt 183.1 lb

## 2017-05-08 DIAGNOSIS — Z23 Encounter for immunization: Secondary | ICD-10-CM | POA: Diagnosis not present

## 2017-05-08 DIAGNOSIS — Z0000 Encounter for general adult medical examination without abnormal findings: Secondary | ICD-10-CM

## 2017-05-08 DIAGNOSIS — F411 Generalized anxiety disorder: Secondary | ICD-10-CM

## 2017-05-08 DIAGNOSIS — N938 Other specified abnormal uterine and vaginal bleeding: Secondary | ICD-10-CM | POA: Diagnosis not present

## 2017-05-08 DIAGNOSIS — R7303 Prediabetes: Secondary | ICD-10-CM | POA: Diagnosis not present

## 2017-05-08 DIAGNOSIS — R7989 Other specified abnormal findings of blood chemistry: Secondary | ICD-10-CM | POA: Insufficient documentation

## 2017-05-08 DIAGNOSIS — Z803 Family history of malignant neoplasm of breast: Secondary | ICD-10-CM | POA: Diagnosis not present

## 2017-05-08 DIAGNOSIS — E229 Hyperfunction of pituitary gland, unspecified: Principal | ICD-10-CM

## 2017-05-08 DIAGNOSIS — Z8619 Personal history of other infectious and parasitic diseases: Secondary | ICD-10-CM | POA: Diagnosis not present

## 2017-05-08 DIAGNOSIS — B001 Herpesviral vesicular dermatitis: Secondary | ICD-10-CM | POA: Diagnosis not present

## 2017-05-08 DIAGNOSIS — R748 Abnormal levels of other serum enzymes: Secondary | ICD-10-CM

## 2017-05-08 MED ORDER — FLUOXETINE HCL 10 MG PO CAPS: 10.0000 mg | ORAL_CAPSULE | Freq: Every day | ORAL | 2 refills | Status: DC

## 2017-05-08 MED ORDER — VALACYCLOVIR HCL 500 MG PO TABS: 500.0000 mg | ORAL_TABLET | Freq: Two times a day (BID) | ORAL | 2 refills | Status: DC | PRN

## 2017-05-08 NOTE — Assessment & Plan Note (Signed)
Due for screening mammogram this month, she will schedule.

## 2017-05-08 NOTE — Assessment & Plan Note (Signed)
Slight increase in A1C, will continue to closely monitor. Repeat in 6 months.

## 2017-05-08 NOTE — Addendum Note (Signed)
Addended by: Tawnya CrookSAMBATH, Denys Labree on: 05/08/2017 11:47 AM   Modules accepted: Orders

## 2017-05-08 NOTE — Patient Instructions (Signed)
Start exercising. You should be getting 150 minutes of moderate intensity exercise weekly.  It's important to improve your diet by reducing consumption of fast food, fried food, processed snack foods, sugary drinks. Increase consumption of fresh vegetables and fruits, whole grains, water.  Ensure you are drinking 64 ounces of water daily.  Schedule your mammogram as discussed.  Schedule a lab only appointment in 6 months to recheck blood sugar level.  Follow up in 1 year for annual exam or sooner if needed.  It was a pleasure to see you today!

## 2017-05-08 NOTE — Assessment & Plan Note (Signed)
Immunization UTD. Influenza vaccination provided today. Discussed the importance of a healthy diet and regular exercise in order for weight loss, and to reduce the risk of other medical problems. Pap UTD. Mammogram due, she will schedule. Exam unremarkable. Labs overall stable. Follow up in 1 year.

## 2017-05-08 NOTE — Assessment & Plan Note (Signed)
Following with GYN

## 2017-05-08 NOTE — Assessment & Plan Note (Signed)
No recent outbreaks, refills sent of Valtrex.

## 2017-05-08 NOTE — Progress Notes (Signed)
Subjective:    Patient ID: Kelly Sparks, female    DOB: 20-Jan-1976, 41 y.o.   MRN: [id number]  HPI  Kelly Sparks is a 41 year old female who presents today for complete physical.  Immunizations: -Tetanus: Completed in 2013 -Influenza: Due today.   Diet: She endorses a poor diet. Breakfast: Bagel, cereal Lunch: Fast food Dinner: Fast food Snacks: None Desserts: 2-3 times weekly Beverages: Water, occasional sweet tea, some juice  Exercise: She does not currently exercise.  Eye exam: Completed 3 years ago, no changes in vision. Dental exam: Completes semi-annually Pap Smear: Following with GYN. Due in 2021 Mammogram: Completed in 2017, she will schedule.    Review of Systems  Constitutional: Negative for unexpected weight change.  HENT: Negative for rhinorrhea.   Respiratory: Negative for cough and shortness of breath.   Cardiovascular: Negative for chest pain.  Gastrointestinal: Negative for constipation and diarrhea.  Genitourinary: Positive for menstrual problem. Negative for difficulty urinating.  Musculoskeletal: Negative for arthralgias and myalgias.  Skin: Negative for rash.  Allergic/Immunologic: Negative for environmental allergies.  Neurological: Negative for dizziness, numbness and headaches.  Psychiatric/Behavioral:       Feels improved on Fluoxetine.       Past Medical History:  Diagnosis Date  . Hemorrhoids   . Rectal bleeding 02/10/2013     Social History   Social History  . Marital status: Married    Spouse name: N/A  . Number of children: N/A  . Years of education: N/A   Occupational History  . Not on file.   Social History Main Topics  . Smoking status: Never Smoker  . Smokeless tobacco: Never Used  . Alcohol use 0.6 oz/week    1 Standard drinks or equivalent per week     Comment: occasional - 1 mixed drink/mo  . Drug use: No  . Sexual activity: Yes   Other Topics Concern  . Not on file   Social History Narrative  . No  narrative on file    Past Surgical History:  Procedure Laterality Date  . NO PAST SURGERIES    . TONSILLECTOMY Bilateral 10/17/2015   Procedure: TONSILLECTOMY;  Surgeon: Bud Facereighton Vaught, MD;  [facility];  Service: ENT;  Laterality: Bilateral;    Family History  Problem Relation Age of Onset  . Breast cancer Maternal Aunt 30  . Uterine cancer Maternal Aunt   . Cancer Father        lung  . Hyperlipidemia Father   . Hypertension Father   . Hyperlipidemia Mother   . Hypertension Mother   . Diabetes Maternal Uncle   . Breast cancer Paternal Aunt 40    No Known Allergies  Current Outpatient Prescriptions on File Prior to Visit  Medication Sig Dispense Refill  . BIOTIN PO Take 1 capsule by mouth daily.    . Multiple Vitamin (MULTIVITAMIN) tablet Take 1 tablet by mouth daily.    . Probiotic Product (PROBIOTIC-10) CAPS Take 1 tablet by mouth daily.     No current facility-administered medications on file prior to visit.     BP 122/78   Pulse 80   Temp 98.2 F (36.8 C) (Oral)   Ht 5\' 4"  (1.626 m)   Wt 183 lb 1.9 oz (83.1 kg)   LMP 04/26/2017   SpO2 99%   BMI 31.43 kg/m    Objective:   Physical Exam  Constitutional: She is oriented to person, place, and time. She appears well-nourished.  HENT:  Right Ear:  Tympanic membrane and ear canal normal.  Left Ear: Tympanic membrane and ear canal normal.  Nose: Nose normal.  Mouth/Throat: Oropharynx is clear and moist.  Eyes: Pupils are equal, round, and reactive to light. Conjunctivae and EOM are normal.  Neck: Neck supple. No thyromegaly present.  Cardiovascular: Normal rate and regular rhythm.   No murmur heard. Pulmonary/Chest: Effort normal and breath sounds normal. She has no rales.  Abdominal: Soft. Bowel sounds are normal. There is no tenderness.  Musculoskeletal: Normal range of motion.  Lymphadenopathy:    She has no cervical adenopathy.  Neurological: She is alert and oriented to person, place,  and time. She has normal reflexes. No cranial nerve deficit.  Skin: Skin is warm and dry. No rash noted.  Psychiatric: She has a normal mood and affect.          Assessment & Plan:

## 2017-05-08 NOTE — Assessment & Plan Note (Signed)
Improved on fluoxetine 10 mg, felt too jittery on 20 mg dose. Continue fluoxetine 10 mg, refills sent to pharmacy.

## 2017-05-08 NOTE — Telephone Encounter (Signed)
Pt aware of persistent elevated prolactin, both fasting and non-fasting. Will refer to endocrine for further eval. Question cause of DUB sx for the past 3 months. U/S was essentially neg, with few small leio. EM=7.774mm.

## 2017-05-08 NOTE — Assessment & Plan Note (Signed)
WNL on recent labs.

## 2017-05-14 ENCOUNTER — Encounter: Payer: Self-pay | Admitting: Obstetrics and Gynecology

## 2017-05-20 ENCOUNTER — Other Ambulatory Visit: Payer: Self-pay | Admitting: Primary Care

## 2017-05-20 ENCOUNTER — Other Ambulatory Visit: Payer: Self-pay | Admitting: Internal Medicine

## 2017-05-20 ENCOUNTER — Encounter: Payer: Self-pay | Admitting: Obstetrics and Gynecology

## 2017-05-20 DIAGNOSIS — Z1231 Encounter for screening mammogram for malignant neoplasm of breast: Secondary | ICD-10-CM

## 2017-05-27 ENCOUNTER — Ambulatory Visit
Admission: RE | Admit: 2017-05-27 | Discharge: 2017-05-27 | Disposition: A | Discharge status: Home / Self Care | Payer: 59 | Source: Ambulatory Visit | Attending: Primary Care | Admitting: Primary Care

## 2017-05-27 DIAGNOSIS — Z1231 Encounter for screening mammogram for malignant neoplasm of breast: Secondary | ICD-10-CM | POA: Diagnosis not present

## 2017-06-10 ENCOUNTER — Ambulatory Visit (INDEPENDENT_AMBULATORY_CARE_PROVIDER_SITE_OTHER): Payer: 59 | Admitting: Obstetrics and Gynecology

## 2017-06-10 ENCOUNTER — Encounter: Payer: Self-pay | Admitting: Obstetrics and Gynecology

## 2017-06-10 VITALS — BP 120/78 | HR 80 | Ht 64.0 in | Wt 190.0 lb

## 2017-06-10 DIAGNOSIS — N938 Other specified abnormal uterine and vaginal bleeding: Secondary | ICD-10-CM

## 2017-06-10 DIAGNOSIS — Z7183 Encounter for nonprocreative genetic counseling: Secondary | ICD-10-CM | POA: Diagnosis not present

## 2017-06-10 NOTE — Progress Notes (Signed)
   Chief Complaint  Patient presents with  . Results    My Risk Results    HPI:   [name] is a 41 y.o. G2P2 who LMP was Patient's last menstrual period was 06/07/2017., presents today for  My Risk cancer genetic testing results.  Results are negative for a cancer genetic mutation except for POLE VUS.  IBIS=14 % Riskscore=NA Last mammogram 05/27/17 Vitamin D status: not taking supp  Pt was also seen 9/18 for DUB sx. Menses have returned to normal, monthly. Prolactin was slightly elevated twice but pt cancelled endocrine appt since DUB sx stopped. U/S was normal except small leio, EM =7.4 mm.   Patient Active Problem List   Diagnosis Date Noted  . Preventative health care 05/08/2017  . Prolactin increased (HCC) 05/08/2017  . Family history of breast cancer 04/29/2017  . DUB (dysfunctional uterine bleeding) 04/29/2017  . Genetic testing of female 04/29/2017  . GAD (generalized anxiety disorder) 03/18/2017  . Prediabetes 04/29/2016  . Elevated liver enzymes 04/29/2016  . Cold sore 04/29/2016    Family History  Problem Relation Age of Onset  . Breast cancer Maternal Aunt 30  . Uterine cancer Maternal Aunt   . Cancer Father        lung  . Hyperlipidemia Father   . Hypertension Father   . Hyperlipidemia Mother   . Hypertension Mother   . Diabetes Maternal Uncle   . Breast cancer Paternal Aunt 40     Assessment/Plan: Encounter for nonprocreative genetic counseling - Pt is MyRisk neg except POLE VUS. IBIS=14%.  DUB (dysfunctional uterine bleeding) - Sx resolved. F/u if recur for EMB and prolactin rechk.    Recommended monthly SBE, yearly CBE, yearly mammos and add Vit D3 2000 IU dialy.    Patient understands these results only apply to her and her children, and this is not indicative of genetic testing results of her other family members. It is recommended that her other family members have genetic testing done. She also understands negative genetic testing  doesn't mean she will never get any of these cancers.   Results handout given to pt.  15  Min spent in direct contact with pt re: counseling/coordination of care.   Breeanna Galgano B. Jasminemarie Sherrard, PA-C  06/10/2017 2:52 PM

## 2017-09-16 ENCOUNTER — Encounter: Payer: Self-pay | Admitting: Primary Care

## 2017-09-16 ENCOUNTER — Ambulatory Visit (INDEPENDENT_AMBULATORY_CARE_PROVIDER_SITE_OTHER): Payer: No Typology Code available for payment source | Admitting: Primary Care

## 2017-09-16 VITALS — BP 112/70 | HR 63 | Temp 98.6°F | Ht 64.0 in | Wt 187.8 lb

## 2017-09-16 DIAGNOSIS — L659 Nonscarring hair loss, unspecified: Secondary | ICD-10-CM | POA: Diagnosis not present

## 2017-09-16 DIAGNOSIS — K219 Gastro-esophageal reflux disease without esophagitis: Secondary | ICD-10-CM | POA: Insufficient documentation

## 2017-09-16 DIAGNOSIS — K649 Unspecified hemorrhoids: Secondary | ICD-10-CM | POA: Diagnosis not present

## 2017-09-16 MED ORDER — HYDROCORTISONE ACETATE 25 MG RE SUPP: 25.0000 mg | Freq: Two times a day (BID) | RECTAL | 0 refills | Status: DC

## 2017-09-16 MED ORDER — RANITIDINE HCL 150 MG PO TABS: 150.0000 mg | ORAL_TABLET | Freq: Two times a day (BID) | ORAL | 0 refills | Status: DC

## 2017-09-16 NOTE — Assessment & Plan Note (Signed)
Intermittent for years, history of chronic constipation. Overall stable and infrequent episodes. Will provide her with a prescription for anusol HC suppositories to use with next flare. She will update.

## 2017-09-16 NOTE — Progress Notes (Signed)
Subjective:    Patient ID: Kelly Sparks, female    DOB: 10/28/1975, 42 y.o.   MRN: [id number]  HPI  Kelly Sparks is a 42 year old female who presents today with multiple complaints.  1) Chronic Constipation: Reports constipation that is chronic, was once seeing a general surgeon for this and rectal bleeding in the past and was diagnosed internal hemorrhoids that did not require surgical intervention. She's experienced rectal bleeding intermittently over the past one year after firm stools. She'll notice the bleeding on her toilet paper that is bright red. She's not had rectal bleeding in one month. She's never had prescription treatment for hemorrhoids.   She's used OTC stool softeners and laxatives in the past with improvement in constipation. She'll experience bowel movements 3-4 times weekly on average which is typical for her.   2) GERD: Symptoms of nausea with esophageal burning over the last one year. Symptoms had progressed over the last one year, worse at night when laying down that would wake her from sleep. She started to avoid eating late at night. She started Nexium OTC four days ago and has noticed improvement with nausea and acid reflux.   3) Hair Loss: Intermittent hair loss over the past three years. Was once following with dermatology through [facility]. She's been undergoing injections over the last 6 months through her dermatologist at [facility]. She is needing a referral for a new dermatology [place] through [facility].   Review of Systems  Gastrointestinal: Positive for constipation and nausea. Negative for abdominal pain.       Esophageal burning. Intermittent hemorrhoids.  Skin:       Chronic hair loss       Past Medical History:  Diagnosis Date  . BRCA negative 05/2017   BRCA/MyRisk except POLE VUS  . Family history of breast cancer 05/2017   MyRisk neg; IBIS=14%  . Hemorrhoids   . Rectal bleeding 02/10/2013     Social  History   Socioeconomic History  . Marital status: Married    Spouse name: Not on file  . Number of children: Not on file  . Years of education: Not on file  . Highest education level: Not on file  Social Needs  . Financial resource strain: Not on file  . Food insecurity - worry: Not on file  . Food insecurity - inability: Not on file  . Transportation needs - medical: Not on file  . Transportation needs - non-medical: Not on file  Occupational History  . Not on file  Tobacco Use  . Smoking status: Never Smoker  . Smokeless tobacco: Never Used  Substance and Sexual Activity  . Alcohol use: Yes    Alcohol/week: 0.6 oz    Types: 1 Standard drinks or equivalent per week    Comment: occasional - 1 mixed drink/mo  . Drug use: No  . Sexual activity: Yes  Other Topics Concern  . Not on file  Social History Narrative  . Not on file    Past Surgical History:  Procedure Laterality Date  . NO PAST SURGERIES    . TONSILLECTOMY Bilateral 10/17/2015   Procedure: TONSILLECTOMY;  Surgeon: Carloyn Manner, MD;  Location: [place];  Service: ENT;  Laterality: Bilateral;    Family History  Problem Relation Age of Onset  . Breast cancer Maternal Aunt 30  . Uterine cancer Maternal Aunt   . Cancer Father        lung  . Hyperlipidemia Father   .  Hypertension Father   . Hyperlipidemia Mother   . Hypertension Mother   . Diabetes Maternal Uncle   . Breast cancer Paternal Aunt 48    No Known Allergies  Current Outpatient Medications on File Prior to Visit  Medication Sig Dispense Refill  . BIOTIN PO Take 1 capsule by mouth daily.    . Multiple Vitamin (MULTIVITAMIN) tablet Take 1 tablet by mouth daily.    . Probiotic Product (PROBIOTIC-10) CAPS Take 1 tablet by mouth daily.    . valACYclovir (VALTREX) 500 MG tablet Take 1 tablet (500 mg total) by mouth 2 (two) times daily as needed. 30 tablet 2   No current facility-administered medications on file prior to visit.      BP 112/70   Pulse 63   Temp 98.6 F (37 C) (Oral)   Ht _0  (1.626 m)   Wt 187 lb 12.8 oz (85.2 kg)   LMP 09/02/2017   SpO2 99%   BMI 32.24 kg/m    Objective:   Physical Exam  Constitutional: She appears well-nourished.  Neck: Neck supple.  Cardiovascular: Normal rate and regular rhythm.  Pulmonary/Chest: Effort normal and breath sounds normal.  Abdominal: Soft. Bowel sounds are normal. There is no tenderness.  Genitourinary: Rectal exam shows guaiac negative stool.  Genitourinary Comments: Small, non thrombosed external hemorrhoid, potential small non thrombosed internal hemorrhoid.           Assessment & Plan:

## 2017-09-16 NOTE — Assessment & Plan Note (Signed)
Chronic for years, referral placed to Dermatology for continued treatment.

## 2017-09-16 NOTE — Assessment & Plan Note (Signed)
Symptoms representative. Will have her finish her bottle of Nexium then switch to Zantac 150 mg once to twice daily. Education provided regarding GERD triggers. She will update if symptoms persist.

## 2017-09-16 NOTE — Patient Instructions (Addendum)
You may use the Anusol suppositories when a hemorrhoid develops. Insert one suppository twice daily for 6 days.  You will be contacted regarding your referral to GI. Please let us know if you have not been contacted within one week.   Continue Nexium until your bottle is empty.  Start ranitidine 150 mg once to twice daily thereafter. Please notify me if your symptoms return after several weeks on ranitidine.   It was a pleasure to see you today!   Hemorrhoids Hemorrhoids are swollen veins in and around the rectum or anus. There are two types of hemorrhoids:  Internal hemorrhoids. These occur in the veins that are just inside the rectum. They may poke through to the outside and become irritated and painful.  External hemorrhoids. These occur in the veins that are outside of the anus and can be felt as a painful swelling or hard lump near the anus.  Most hemorrhoids do not cause serious problems, and they can be managed with home treatments such as diet and lifestyle changes. If home treatments do not help your symptoms, procedures can be done to shrink or remove the hemorrhoids. What are the causes? This condition is caused by increased pressure in the anal area. This pressure may result from various things, including:  Constipation.  Straining to have a bowel movement.  Diarrhea.  Pregnancy.  Obesity.  Sitting for long periods of time.  Heavy lifting or other activity that causes you to strain.  Anal sex.  What are the signs or symptoms? Symptoms of this condition include:  Pain.  Anal itching or irritation.  Rectal bleeding.  Leakage of stool (feces).  Anal swelling.  One or more lumps around the anus.  How is this diagnosed? This condition can often be diagnosed through a visual exam. Other exams or tests may also be done, such as:  Examination of the rectal area with a gloved hand (digital rectal exam).  Examination of the anal canal using a small tube  (anoscope).  A blood test, if you have lost a significant amount of blood.  A test to look inside the colon (sigmoidoscopy or colonoscopy).  How is this treated? This condition can usually be treated at home. However, various procedures may be done if dietary changes, lifestyle changes, and other home treatments do not help your symptoms. These procedures can help make the hemorrhoids smaller or remove them completely. Some of these procedures involve surgery, and others do not. Common procedures include:  Rubber band ligation. Rubber bands are placed at the base of the hemorrhoids to cut off the blood supply to them.  Sclerotherapy. Medicine is injected into the hemorrhoids to shrink them.  Infrared coagulation. A type of light energy is used to get rid of the hemorrhoids.  Hemorrhoidectomy surgery. The hemorrhoids are surgically removed, and the veins that supply them are tied off.  Stapled hemorrhoidopexy surgery. A circular stapling device is used to remove the hemorrhoids and use staples to cut off the blood supply to them.  Follow these instructions at home: Eating and drinking  Eat foods that have a lot of fiber in them, such as whole grains, beans, nuts, fruits, and vegetables. Ask your health care provider about taking products that have added fiber (fiber supplements).  Drink enough fluid to keep your urine clear or pale yellow. Managing pain and swelling  Take warm sitz baths for 20 minutes, 3-4 times a day to ease pain and discomfort.  If directed, apply ice to the affected  area. Using ice packs between sitz baths may be helpful. ? Put ice in a plastic bag. ? Place a towel between your skin and the bag. ? Leave the ice on for 20 minutes, 2-3 times a day. General instructions  Take over-the-counter and prescription medicines only as told by your health care provider.  Use medicated creams or suppositories as told.  Exercise regularly.  Go to the bathroom when you  have the urge to have a bowel movement. Do not wait.  Avoid straining to have bowel movements.  Keep the anal area dry and clean. Use wet toilet paper or moist towelettes after a bowel movement.  Do not sit on the toilet for long periods of time. This increases blood pooling and pain. Contact a health care provider if:  You have increasing pain and swelling that are not controlled by treatment or medicine.  You have uncontrolled bleeding.  You have difficulty having a bowel movement, or you are unable to have a bowel movement.  You have pain or inflammation outside the area of the hemorrhoids. This information is not intended to replace advice given to you by your health care provider. Make sure you discuss any questions you have with your health care provider. Document Released: 08/15/2000 Document Revised: 01/16/2016 Document Reviewed: 05/02/2015 Elsevier Interactive Patient Education  [employer].

## 2017-10-25 ENCOUNTER — Encounter: Payer: Self-pay | Admitting: Obstetrics and Gynecology

## 2017-10-26 ENCOUNTER — Encounter: Payer: Self-pay | Admitting: Primary Care

## 2017-10-26 NOTE — Telephone Encounter (Signed)
Please advise 

## 2017-10-29 ENCOUNTER — Ambulatory Visit: Payer: No Typology Code available for payment source | Admitting: Maternal Newborn

## 2017-10-29 ENCOUNTER — Encounter: Payer: Self-pay | Admitting: Maternal Newborn

## 2017-10-29 VITALS — BP 120/80 | HR 72 | Ht 64.0 in | Wt 186.0 lb

## 2017-10-29 DIAGNOSIS — N898 Other specified noninflammatory disorders of vagina: Secondary | ICD-10-CM | POA: Diagnosis not present

## 2017-10-29 DIAGNOSIS — N939 Abnormal uterine and vaginal bleeding, unspecified: Secondary | ICD-10-CM

## 2017-10-29 NOTE — Progress Notes (Signed)
Obstetrics & Gynecology Office Visit   Chief Complaint:  Chief Complaint  Patient presents with  . Gynecologic Exam    d/c - blood mixed in it, ?left tampon, odor    History of Present Illness: Patient has a history of irregular menses and has been evaluated for the same recently. She has just completed a menstrual cycle and has noticed for the past two days that she has a slight brownish discharge that looked like old blood and had a bad odor when it began. She has used coconut oil and boric acid and the odor improved. However, she is concerned that she may have forgotten to remove a tampon and would like to ensure that one is not present.   Review of Systems: 10 point review of systems negative unless otherwise noted in HPI.  Past Medical History:  Past Medical History:  Diagnosis Date  . BRCA negative 05/2017   BRCA/MyRisk except POLE VUS  . Family history of breast cancer 05/2017   MyRisk neg; IBIS=14%  . Hemorrhoids   . Rectal bleeding 02/10/2013    Past Surgical History:  Past Surgical History:  Procedure Laterality Date  . NO PAST SURGERIES    . TONSILLECTOMY Bilateral 10/17/2015   Procedure: TONSILLECTOMY;  Surgeon: Carloyn Manner, MD;  Location: [place];  Service: ENT;  Laterality: Bilateral;    Gynecologic History: Patient's last menstrual period was 10/21/2017.  Obstetric History: G2P2  Family History:  Family History  Problem Relation Age of Onset  . Breast cancer Maternal Aunt 30  . Uterine cancer Maternal Aunt   . Cancer Father        lung  . Hyperlipidemia Father   . Hypertension Father   . Hyperlipidemia Mother   . Hypertension Mother   . Diabetes Maternal Uncle   . Breast cancer Paternal Aunt 15    Social History:  Social History   Socioeconomic History  . Marital status: Married    Spouse name: Not on file  . Number of children: Not on file  . Years of education: Not on file  . Highest education level: Not on file    Social Needs  . Financial resource strain: Not on file  . Food insecurity - worry: Not on file  . Food insecurity - inability: Not on file  . Transportation needs - medical: Not on file  . Transportation needs - non-medical: Not on file  Occupational History  . Not on file  Tobacco Use  . Smoking status: Never Smoker  . Smokeless tobacco: Never Used  Substance and Sexual Activity  . Alcohol use: Yes    Alcohol/week: 0.6 oz    Types: 1 Standard drinks or equivalent per week    Comment: occasional - 1 mixed drink/mo  . Drug use: No  . Sexual activity: Yes    Birth control/protection: Surgical    Comment: vasectomy  Other Topics Concern  . Not on file  Social History Narrative  . Not on file    Allergies:  No Known Allergies  Medications: Prior to Admission medications   Medication Sig Start Date End Date Taking? Authorizing Provider  betamethasone dipropionate (DIPROLENE) 0.05 % ointment Apply 1 application topically daily. 10/14/17  Yes [provider]  BIOTIN PO Take 1 capsule by mouth daily.   Yes [provider]  clobetasol ointment (TEMOVATE) 9.67 % Apply 1 application topically as needed. 10/29/17  Yes [provider]  hydrocortisone (ANUSOL-HC) 25 MG suppository Place 1 suppository (25  mg total) rectally 2 (two) times daily. 09/16/17  Yes Pleas Koch, NP  Probiotic Product (PROBIOTIC-10) CAPS Take 1 tablet by mouth daily.   Yes [provider]  ranitidine (ZANTAC) 150 MG tablet Take 1 tablet (150 mg total) by mouth 2 (two) times daily. 09/16/17  Yes Pleas Koch, NP  valACYclovir (VALTREX) 500 MG tablet Take 1 tablet (500 mg total) by mouth 2 (two) times daily as needed. 05/08/17  Yes Pleas Koch, NP    Physical Exam Vitals:  Vitals:   10/29/17 1404  BP: 120/80  Pulse: 72   Patient's last menstrual period was 10/21/2017.  General: NAD HEENT: normocephalic, anicteric Genitourinary:  External: Normal external  female genitalia.  Normal urethral  meatus, normal Bartholin's and Skene's glands.    Vagina: Normal vaginal mucosa, no evidence of prolapse.    Cervix: Grossly normal in appearance, no bleeding, no signs of a  tampon or foreign objects on speculum exam, no discharge seen  Uterus: Not examined  Adnexa: Not examined  Rectal: deferred  Lymphatic: no evidence of inguinal lymphadenopathy Extremities: no edema, erythema, or tenderness Neurologic: Grossly intact Psychiatric: mood appropriate, affect full  Assessment: 42 y.o. G2P2 concerned about discharge and possible retained tampon, with nothing found in the vagina.  Plan: Problem List Items Addressed This Visit    None    Visit Diagnoses    Vaginal discharge    -  Primary   Abnormal uterine bleeding       Relevant Orders   Prolactin (Completed)     Advised that the discharge she had could be some tapering off of  bleeding at the end of her cycle, as she is not having any other symptoms of a vaginal infection and no discharge is seen on exam today.  Patient requested to have a prolactin level drawn while she is here today as part of the evaluation of the cause of her irregular menses, per recent MyChart comminucations with Alicia Copland. Lab ordered.  A total of 10 minutes were spent in face-to-face contact with the patient during this encounter with over half of that time devoted to counseling and coordination of care.  Avel Sensor, CNM 10/29/2017

## 2017-10-30 ENCOUNTER — Encounter: Payer: Self-pay | Admitting: Maternal Newborn

## 2017-10-30 LAB — PROLACTIN: PROLACTIN: 17.4 ng/mL (ref 4.8–23.3)

## 2017-11-05 ENCOUNTER — Other Ambulatory Visit: Payer: No Typology Code available for payment source

## 2017-11-05 ENCOUNTER — Encounter: Payer: Self-pay | Admitting: Obstetrics and Gynecology

## 2017-12-02 ENCOUNTER — Encounter: Payer: Self-pay | Admitting: Family Medicine

## 2017-12-02 ENCOUNTER — Ambulatory Visit (INDEPENDENT_AMBULATORY_CARE_PROVIDER_SITE_OTHER): Payer: No Typology Code available for payment source | Admitting: Family Medicine

## 2017-12-02 VITALS — BP 118/81 | HR 69 | Ht 64.0 in | Wt 187.0 lb

## 2017-12-02 DIAGNOSIS — N938 Other specified abnormal uterine and vaginal bleeding: Secondary | ICD-10-CM

## 2017-12-02 DIAGNOSIS — Z3202 Encounter for pregnancy test, result negative: Secondary | ICD-10-CM | POA: Diagnosis not present

## 2017-12-02 DIAGNOSIS — Z30430 Encounter for insertion of intrauterine contraceptive device: Secondary | ICD-10-CM | POA: Diagnosis not present

## 2017-12-02 LAB — POCT URINE PREGNANCY: PREG TEST UR: NEGATIVE

## 2017-12-02 MED ORDER — LEVONORGESTREL 19.5 MCG/DAY IU IUD
INTRAUTERINE_SYSTEM | Freq: Once | INTRAUTERINE | Status: AC
  Administered 2017-12-02: 16:00:00 via INTRAUTERINE

## 2017-12-02 NOTE — Progress Notes (Signed)
GYNECOLOGY ANNUAL PREVENTATIVE CARE ENCOUNTER NOTE  Subjective:   Kelly Sparks is a 42 y.o. 413P2012 female here for a a problem GYN visit.  Current complaints: AUB, having 2 cycles per month for 1 year. Reports having elevated prolactin than decreased spontaneously. TSH wnl.  Here to discussed EMB, labs and get a second opinion regarding   Denies abnormal vaginal bleeding, discharge, pelvic pain, problems with intercourse or other gynecologic concerns.    Gynecologic History Patient's last menstrual period was 11/22/2017. Contraception: vasectomy Last Pap: 04/2017. Results were: normal Last mammogram: NA  The following portions of the patient's history were reviewed and updated as appropriate: allergies, current medications, past family history, past medical history, past social history, past surgical history and problem list.  Review of Systems Pertinent items are noted in HPI.   Objective:  BP 118/81   Pulse 69   Ht 5\' 4"  (1.626 m)   Wt 187 lb (84.8 kg)   LMP 11/22/2017   BMI 32.10 kg/m  CONSTITUTIONAL: Well-developed, well-nourished female in no acute distress.  HENT:  Normocephalic, atraumatic, External right and left ear normal. Oropharynx is clear and moist EYES:  No scleral icterus.  NECK: Normal range of motion, supple, no masses.  Normal thyroid.  SKIN: Skin is warm and dry. No rash noted. Not diaphoretic. No erythema. No pallor. NEUROLOGIC: Alert and oriented to person, place, and time. Normal reflexes, muscle tone coordination. No cranial nerve deficit noted. PSYCHIATRIC: Normal mood and affect. Normal behavior. Normal judgment and thought content. CARDIOVASCULAR: Normal heart rate noted, regular rhythm. 2+ distal pulses. RESPIRATORY: Effort and breath sounds normal, no problems with respiration noted. BREASTS: Symmetric in size. No masses, skin changes, nipple drainage, or lymphadenopathy. ABDOMEN: Soft,  no distention noted.  No tenderness, rebound or guarding.    PELVIC: Normal appearing external genitalia; normal appearing vaginal mucosa and cervix.  No abnormal discharge noted. Normal uterine size, no other palpable masses, no uterine or adnexal tenderness. MUSCULOSKELETAL: Normal range of motion.    ENDOMETRIAL BIOPSY     The indications for endometrial biopsy were reviewed.   Risks of the biopsy including cramping, bleeding, infection, uterine perforation, inadequate specimen and need for additional procedures  were discussed. The patient states she understands and agrees to undergo procedure today. Consent was signed. Time out was performed. Urine HCG was negative. During the pelvic exam, the cervix was prepped with Betadine. The 3 mm pipelle was introduced into the endometrial cavity without difficulty to a depth of 8cm, and a moderate amount of tissue was obtained and sent to pathology. The instruments were removed from the patient's vagina. Minimal bleeding from the cervix was noted. The patient tolerated the procedure well. Routine post-procedure instructions were given to the patient.     IUD Insertion Procedure Note Patient identified, informed consent performed, consent signed.   Discussed risks of irregular bleeding, cramping, infection, malpositioning or misplacement of the IUD outside the uterus which may require further procedure such as laparoscopy. Time out was performed.  Urine pregnancy test negative.  Speculum placed in the vagina.  Cervix visualized, previously cleaned for EMB. Uterus sounded to 8 cm with pipelle.  LNG IUD placed per manufacturer's recommendations. No tenaculum used. Strings trimmed to 3 cm. Patient tolerated procedure well.   Patient was given post-procedure instructions.  She was advised to have backup contraception for one week.  Patient was also asked to check IUD strings periodically and follow up in 4 weeks for IUD check.   Assessment  and Plan:   1. DUB (dysfunctional uterine bleeding) - EBM today with LNG  IUD placement - Discussed that FSH/LH may not be useful  - Reviewed endometrial hyperplasia and risk factors. Patient opted for endometrial sampling prior to LNG IUD placement. Has had IUD before and liked this method.  - Multiple Vitamin (MULTIVITAMIN) tablet; Take 1 tablet by mouth daily. - CBC - POCT urine pregnancy - Surgical pathology - Levonorgestrel (LILETTA) 19.5 MCG/DAY IUD - Procedure Report - Scanned   Please refer to After Visit Summary for other counseling recommendations.   Return in about 1 month (around 12/30/2017) for IUD string check, review EBM results.  Federico Flake, MD, MPH, ABFM Attending Physician Faculty Practice- [facility]

## 2017-12-02 NOTE — Progress Notes (Signed)
Last pap 08/18 - Normal Pt here for AUB. She has a cycle q14d  That last for 7 days. She has had prolactin checked which was elevated and now normal. Prevouis OBG wanted to send her to endocrinology but pt declined. Previous OBG wanted EMB but pt wants LH and FSH checked before EMB.

## 2017-12-03 LAB — CBC
HEMATOCRIT: 36.7 % (ref 34.0–46.6)
HEMOGLOBIN: 12.4 g/dL (ref 11.1–15.9)
MCH: 29.9 pg (ref 26.6–33.0)
MCHC: 33.8 g/dL (ref 31.5–35.7)
MCV: 88 fL (ref 79–97)
Platelets: 333 10*3/uL (ref 150–379)
RBC: 4.15 x10E6/uL (ref 3.77–5.28)
RDW: 14.1 % (ref 12.3–15.4)
WBC: 8.5 10*3/uL (ref 3.4–10.8)

## 2017-12-05 ENCOUNTER — Telehealth: Payer: No Typology Code available for payment source | Admitting: Nurse Practitioner

## 2017-12-05 DIAGNOSIS — R21 Rash and other nonspecific skin eruption: Secondary | ICD-10-CM | POA: Diagnosis not present

## 2017-12-05 MED ORDER — PREDNISONE 10 MG PO TABS: 10.0000 mg | ORAL_TABLET | Freq: Every day | ORAL | 0 refills | Status: DC

## 2017-12-05 NOTE — Progress Notes (Signed)
E Visit for Rash  We are sorry that you are not feeling well. Here is how we plan to help!  Based on what you shared with me it looks like you have contact dermatitis.  Contact dermatitis is a skin rash caused by something that touches the skin and causes irritation or inflammation.  Your skin may be red, swollen, dry, cracked, and itch.  The rash should go away in a few days but can last a few weeks.  If you get a rash, it's important to figure out what caused it so the irritant can be avoided in the future. and I have prescribed Prednisone 10 mg daily for 5 days           HOME CARE:   Take cool showers and avoid direct sunlight.  Apply cool compress or wet dressings.  Take a bath in an oatmeal bath.  Sprinkle content of one Aveeno packet under running faucet with comfortably warm water.  Bathe for 15-20 minutes, 1-2 times daily.  Pat dry with a towel. Do not rub the rash.  Use hydrocortisone cream.  Take an antihistamine like Benadryl for widespread rashes that itch.  The adult dose of Benadryl is 25-50 mg by mouth 4 times daily.  Caution:  This type of medication may cause sleepiness.  Do not drink alcohol, drive, or operate dangerous machinery while taking antihistamines.  Do not take these medications if you have prostate enlargement.  Read package instructions thoroughly on all medications that you take.  GET HELP RIGHT AWAY IF:   Symptoms don't go away after treatment.  Severe itching that persists.  If you rash spreads or swells.  If you rash begins to smell.  If it blisters and opens or develops a yellow-brown crust.  You develop a fever.  You have a sore throat.  You become short of breath.  MAKE SURE YOU:  Understand these instructions. Will watch your condition. Will get help right away if you are not doing well or get worse.  Thank you for choosing an e-visit. Your e-visit answers were reviewed by a board certified advanced clinical practitioner to  complete your personal care plan. Depending upon the condition, your plan could have included both over the counter or prescription medications. Please review your pharmacy choice. Be sure that the pharmacy you have chosen is open so that you can pick up your prescription now.  If there is a problem you may message your provider in MyChart to have the prescription routed to another pharmacy. Your safety is important to us. If you have drug allergies check your prescription carefully.  For the next 24 hours, you can use MyChart to ask questions about today's visit, request a non-urgent call back, or ask for a work or school excuse from your e-visit provider. You will get an email in the next two days asking about your experience. I hope that your e-visit has been valuable and will speed your recovery.     

## 2017-12-09 ENCOUNTER — Encounter: Payer: Self-pay | Admitting: Dietician

## 2017-12-09 ENCOUNTER — Encounter: Payer: No Typology Code available for payment source | Attending: Primary Care | Admitting: Dietician

## 2017-12-09 VITALS — Ht 64.0 in | Wt 188.3 lb

## 2017-12-09 DIAGNOSIS — E669 Obesity, unspecified: Secondary | ICD-10-CM | POA: Insufficient documentation

## 2017-12-09 DIAGNOSIS — E6609 Other obesity due to excess calories: Secondary | ICD-10-CM

## 2017-12-09 DIAGNOSIS — Z6832 Body mass index (BMI) 32.0-32.9, adult: Secondary | ICD-10-CM | POA: Insufficient documentation

## 2017-12-09 DIAGNOSIS — Z713 Dietary counseling and surveillance: Secondary | ICD-10-CM | POA: Insufficient documentation

## 2017-12-09 NOTE — Patient Instructions (Addendum)
   Snack guidelines: try to include a protein + a carbohydrate. For example, cheese and crackers, nuts and fruit, peanut butter and banana, yogurt, [place]urkey and cheese roll ups, hummus and crackers / pretzels, dried fruit & nuts, toast with nut butter or low fat cream cheese, raw veggies and dip or hummus, popcorn  Yogurt: greek or islandic are great high-protein options!  Low sugar: as close to the single digits per serving is best! (example: 9g sugar)  Look for ways to incorporate vegetables into meals, try blending them into meatloaf, meatballs, burgers, casseroles, soups, pasta dishes  Eliminate barriers to cooking healthy meals  Use your lunch break to prep or cook ingredients, or set up a recipe when you get home from work that can easily be put together when you get home from working out in the evening  Have a few "go-to" recipes that you know you can easily throw together, for example, a favorite stir fry or fajita recipe  Look for frozen veggies, quick cook grains, and easy air fryer or instant pot recipes to make prep time quick  Think variety, make proteins/ grains/ veggies in bulk, season a few different ways, and put them together in different combinations at meal times  Be creative! For example, if you make burgers you can use them in several ways, or save them in the freezer for a later date. Put the patty on a salad, make a burger out of it, add to a taco, etc.

## 2017-12-09 NOTE — Progress Notes (Signed)
Medical Nutrition Therapy: Visit start time: 1345 end time: 1445  Assessment:  Diagnosis: self-referral, obesity Past medical history: pre-diabetes, GERD Psychosocial issues/ stress concerns: none Preferred learning method:  . Auditory  Current weight: 188.3#  Height: 5\' 4"  Medications, supplements: MVI, Zantac, Probiotic, Valtrex prn  Progress and evaluation: Patient would like to learn about dietary interventions for weight reduction and for overall health improvement. She has a h/o prediabetes. Reported goal wt 165#. She exercises regularly but knows that she does not make the most nutritious food choices. Over the past two weeks she has started to make more of an effort to improve her diet. She has an air fryer at home which she uses frequently, as well as an instant pot which she uses less often. Her job has moved closer to her house, allowing her to go home on lunch break which she says would give her more time to prepare more nutritious food options. She enjoys vegetables but does not make a large variety at home d/t her husband disliking most varieties other than broccoli. She and her husband have been going out to eat at fast food locations more frequently now that her children are in college, however she reports trying to be more conscious of the items she orders when eating at these locations. For example, she may choose grilled chicken or a small side of fries. Traditionally her dinner meals are eaten later at night close to bed time; dx of acid reflux which occurs during the night. Takes medication to manage associated GI symptoms.   Physical activity: Recently started total-body weight training 3 days/ week. Walks or jogs 4-5 days per week, ideally 5, for 30-545min. Also tracks her steps on her Apple Watch and typically gets over 10,000 steps/ day.  Dietary Intake:  Usual eating pattern includes 3 meals and 2 snacks per day. Dining out frequency: 8-9 meals per week.  Breakfast:  smoothie (berries, 1/2 banana, yogurt), mini bagel, toast  Snack: gummies or other candy, pretzels Lunch: fast food: grilled chicken sandwich + small fry Snack: similar to above Supper: fast food similar to lunch or she may cook at home: chicken, asparagus, broccoli, fajita salad (beef, green peppers, onion) Snack: not usually d/t late dinner Beverages: water, diet soda, unsweetened iced tea, coffee with FF cream  Nutrition Care Education: Topics covered: ways to add protein to the diet, low-fat choices / dairy, meal prepping, foods that trigger GERD, snack ideas, plate method, ways to incorporate more vegetables into dinner meals, sugar sweetened beverages, added sugars Basic nutrition: basic food groups, appropriate nutrient balance, appropriate meal and snack schedule, general nutrition guidelines    Weight control: benefits of weight control, behavioral changes for weight loss Advanced nutrition: cooking techniques, dining out, food label reading Other lifestyle changes:  benefits of making changes, increasing motivation, readiness for change, identifying habits that need to change  Nutritional Diagnosis:  [place]-3.3 Overweight/obesity As related to dietary habits/ choices.  As evidenced by BMI 32.32.  Intervention: Discussion as noted above. She was instructed to put more emphasis in protein at meal and snack times, along with choosing more nutritious snack options. She will look for ways to reduce the current barriers to cooking meals at home, and also to eating at an earlier time of night to prevent GERD sx.   Education Materials given:  Marland Kitchen. Quick & Healthy Meals . Eating Out Handout . Goals/ instructions  Learner/ who was taught:  . Patient   Level of understanding: Marland Kitchen. Verbalizes/ demonstrates  competency  Demonstrated degree of understanding via:   Teach back Learning barriers: Marland Kitchen Motivation lacking  Willingness to learn/ readiness for change: . Acceptance, ready for  change  Monitoring and Evaluation:  Dietary intake, exercise, and body weight      follow up: in 3 week(s)

## 2017-12-14 ENCOUNTER — Encounter: Payer: Self-pay | Admitting: *Deleted

## 2017-12-30 ENCOUNTER — Ambulatory Visit (INDEPENDENT_AMBULATORY_CARE_PROVIDER_SITE_OTHER): Payer: No Typology Code available for payment source | Admitting: Family Medicine

## 2017-12-30 ENCOUNTER — Encounter: Payer: Self-pay | Admitting: Family Medicine

## 2017-12-30 VITALS — BP 118/81 | HR 71 | Wt 188.0 lb

## 2017-12-30 DIAGNOSIS — Z975 Presence of (intrauterine) contraceptive device: Secondary | ICD-10-CM | POA: Diagnosis not present

## 2017-12-30 DIAGNOSIS — B9689 Other specified bacterial agents as the cause of diseases classified elsewhere: Secondary | ICD-10-CM

## 2017-12-30 DIAGNOSIS — N760 Acute vaginitis: Secondary | ICD-10-CM

## 2017-12-30 MED ORDER — METRONIDAZOLE 0.75 % VA GEL: 1.0000 | Freq: Every day | VAGINAL | 1 refills | Status: DC

## 2017-12-30 NOTE — Progress Notes (Signed)
   GYNECOLOGY CLINIC- IUD STRING CHECK PROGRESS NOTE/GYN PROBLEM  History:  42 y.o. [id number] here today for today for IUD string check; Mirena IUD was placed  4/3. No complaints about the Mirena, no concerning side effects.  The following portions of the patient's history were reviewed and updated as appropriate: allergies, current medications, past family history, past medical history, past social history, past surgical history and problem list.  Review of Systems:  Pertinent items are noted in HPI.   Objective:  Physical Exam Blood pressure 118/81, pulse 71, weight 188 lb (85.3 kg). Gen: NAD Abd: Soft, nontender and nondistended Pelvic: Normal appearing external genitalia; normal appearing vaginal mucosa and cervix.  IUD strings visualized, about 3 cm in length outside cervix.   Assessment & Plan:  Normal IUD check. Patient to keep IUD in place for five years; can come in for removal if she desires pregnancy within the next five years. Routine preventative health maintenance measures emphasized  #Vaginal odor - triggered by cycle  - Discussed intermittent metrogel use and patient will trial   Federico Flake, MD  Faculty Practice  [facility]

## 2018-01-13 ENCOUNTER — Encounter: Payer: No Typology Code available for payment source | Attending: Primary Care | Admitting: Dietician

## 2018-01-13 ENCOUNTER — Encounter: Payer: Self-pay | Admitting: Dietician

## 2018-01-13 VITALS — Wt 187.5 lb

## 2018-01-13 DIAGNOSIS — Z6832 Body mass index (BMI) 32.0-32.9, adult: Secondary | ICD-10-CM | POA: Insufficient documentation

## 2018-01-13 DIAGNOSIS — E669 Obesity, unspecified: Secondary | ICD-10-CM | POA: Insufficient documentation

## 2018-01-13 DIAGNOSIS — E6609 Other obesity due to excess calories: Secondary | ICD-10-CM

## 2018-01-13 DIAGNOSIS — Z713 Dietary counseling and surveillance: Secondary | ICD-10-CM | POA: Insufficient documentation

## 2018-01-13 NOTE — Progress Notes (Signed)
Medical Nutrition Therapy: Visit start time: 1530  end time: 1600  Assessment:  Diagnosis: Obesity Medical history changes: None Psychosocial issues/ stress concerns: None  Current weight: 187.5lb  Height:  Medications, supplement changes: None  Progress and evaluation: Patient reports making several changes to her diet since last session. She has been focusing on consuming more protein daily, particularly at breakfast. She has also not been skipping breakfast which has helped eliminate mid-morning cravings. The family has cut their eating out habit in half, now going out 3-4 days/week rather than 8. She has been making more of an effort to prepare meals at home and plan meals in advance, especially because her children are now home from college. This also translates into bringing her lunches to work more often and making more nutritious choices on work days. Snacking remains one of her biggest struggles. She has found that if she buys healthy snacks at the store and has them "on hand" at home she is more likely to choose them than when she has to go out of her way to find these types of snacks. Has also continued to struggle with making nutritious choices when eating out as she is used to ordering particular menu items. She has started to think about ways to include different vegetables in her family's eating plan. GERD sx have decreased since she started pre-planning meals, as this has resulted in her eating dinner at an earlier time.   Physical activity: Continues total-body resistance training 3 days/week, walking/jogging 4-5 days/week for 30-45 min each session, and tracking daily steps on her Apple Watch. Wants to focus more on resistance training workouts in the future.  Dietary Intake:  Usual eating pattern includes 3 meals and 2 snacks per day. Dining out frequency: 3-4 meals per week.  Breakfast: bagel with cream cheese + egg, egg muffins, omelet Snacks: apple, peanut butter crackers  were new snack additions  Nutrition Care Education: Topics covered: Oceanographer guidelinesgeneral, fad diets, creating sustainable lifestyle changes, how to plan meals and choose food options when eating out, meal prep, snack ideas, ways to increase vegetable variety and get recipe inspiration Basic nutrition: basic food groups, appropriate nutrient balance, appropriate meal and snack schedule, general nutrition guidelines    Weight control: behavioral changes for weight loss Advanced nutrition: recipe modification, cooking techniques, dining out, food label reading Other lifestyle changes: benefits of making changes, increasing motivation, readiness for change, identifying habits that need to change  Nutritional Diagnosis:  [place]-3.3 Overweight/obesity As related to previous eating habits.  As evidenced by BMI 32.18, patient report of previously making non-nutritious snack and restaurant menu choices.  Intervention: Discussion as noted above. Patient will continue to work on eating more meals at home and planning out meals in advance. She will also continue to look for more nutrient-dense snack options and include protein at meals consistently. She will look for ways to incorporate a wider variety of vegetables into her diet and for ways to make choosing healthier options when eating out easier.  Education Materials given:  Marland Kitchen Goals/ instructions  Learner/ who was taught:  . Patient   Level of understanding: Marland Kitchen Verbalizes/ demonstrates competency  Demonstrated degree of understanding via:   Teach back Learning barriers: . None  Willingness to learn/ readiness for change: . Eager, change in progress  Monitoring and Evaluation:  Dietary intake, exercise, and body weight      follow up: in 3 week(s): June 5th, 2019

## 2018-01-13 NOTE — Patient Instructions (Addendum)
   Have nutritious snacks on hand at home and at work- this way they are just as accessible as less nutritious snacks. Have a few different "go-to" options to buy at the store  When eating out, utilize the plate method (1/2 plate veggies, 1/4 protein, 1/4 plate starch or complex carbohydrate). Choose non-fried/breaded foods and stick to white meats or seafood for proteins more often  Protein bars/ shakes: sugar should be as close to the single digits as possible. Bars should have no more than 250-300 calories for a snack  For vegetables, look for in-season vegetables that may be on sale that you can take home and try out a new recipe or cooking style. Look for some vegetarian dishes that have vegetables as the "star" of the plate to get inspiration  Grill, bake, stir fry are all great cooking methods

## 2018-02-03 ENCOUNTER — Encounter: Payer: No Typology Code available for payment source | Attending: Primary Care | Admitting: Dietician

## 2018-02-03 ENCOUNTER — Encounter: Payer: Self-pay | Admitting: Dietician

## 2018-02-03 VITALS — Wt 188.1 lb

## 2018-02-03 DIAGNOSIS — Z713 Dietary counseling and surveillance: Secondary | ICD-10-CM | POA: Insufficient documentation

## 2018-02-03 DIAGNOSIS — E669 Obesity, unspecified: Secondary | ICD-10-CM | POA: Insufficient documentation

## 2018-02-03 DIAGNOSIS — E6609 Other obesity due to excess calories: Secondary | ICD-10-CM

## 2018-02-03 DIAGNOSIS — Z6832 Body mass index (BMI) 32.0-32.9, adult: Secondary | ICD-10-CM | POA: Insufficient documentation

## 2018-02-03 NOTE — Progress Notes (Signed)
Medical Nutrition Therapy: Visit start time: 1530  end time: 1600  Assessment:  Diagnosis: obesity Medical history changes: none Psychosocial issues/ stress concerns: None  Current weight: 188.1lb  Height: 5\' 4"  Medications, supplement changes: None  Progress and evaluation: Per her report, wt was 185# at home this morning. She reports continuing to make more conscious decisions about her health, both from a nutrition and exercise perspective. She joined a boot camp gym last week with her daughter, as described below, which is helping her to stay consistent with her resistance training which was newly integrated into her exercise regimen approx 1 month ago. The family continues to eat at home more often, and she has started to defrost or lay out food for dinner before leaving for work to make preparation easier. She then takes leftovers to work or goes home during lunch to make something quick, rather than relying on options at work. Food prep for dinner is also an option during her lunch break. Snacking at work, which was previously a struggle, has been improved. She has found nutritious snacks that she brings to work to put in her desk and/or in the fridge that keep her from over-consuming non-nutritious foods provided by coworkers. At home she has been experimenting with ways to incorporate vegetables into meals, for example chopping peppers in ground beef. Her husband was also willing to try cauliflower and carrots with success. Also, she is eating breakfast regularly. Because of these changes she has not experienced GERD sx in over a month.   Physical activity: Total-body resistance training 2-3 days/week, new boot camp classes 3 days/week starting last week, walking/ jogging a few days per week either separate from or incorporated into other workouts  Dietary Intake:  Usual eating pattern includes 3 meals and 2 snacks per day. Dining out frequency: 2-3 meals per week.  Breakfast (new options):  smoothie, eggs more regularly, breakfast meat occasionally. Does not eat bagels + cream cheese much anymore  Nutrition Care Education: Topics covered: *Breakfast ideasWeight management guidelinesgeneral, body recomposition through resistance training, pre-planning meals and snacks, having healthy options easily accessible and available, ways to incorporate vegetables into recipes, peri-workout nutrition Basic nutrition: basic food groups, appropriate nutrient balance, appropriate meal and snack schedule, general nutrition guidelines    Weight control: benefits of weight control, behavioral changes for weight loss Advanced nutrition: recipe modification (adding vegetables into recipes), dining out Other lifestyle changes: benefits of making changes, identifying habits that need to change  Nutritional Diagnosis:  [place]-3.3 Overweight/obesity As related to previous lifestyle habits.  As evidenced by BMI 32.29.  Intervention: Discussion as noted above. She will continue to integrate previously established lifestyle changes so that they remain consistent habits in her life both in terms of nutrition and exercise- for example, eating out less often and consistently eating breakfast. Also, she will continue to look for ways to consume additional servings of vegetables into her (and her family's) meals and to prep meals and snacks ahead of time for easy access and preparation. Because she has started to increase the frequency of resistance training, she will aim to consume a mixture of protein and carbohydrate post-workout in order to aid recovery, along with a small portion of heart healthy fats and/or anti-inflammatory rich fruits and dark green vegetables if desired.  Education Materials given: Marland Kitchen. Goals/ instructions  Learner/ who was taught:  . Patient   Level of understanding: Marland Kitchen. Verbalizes/ demonstrates competency  Demonstrated degree of understanding via:   Teach back Learning  barriers: . None  Willingness to learn/ readiness for change: . Eager, change in progress  Monitoring and Evaluation:  Dietary intake, exercise, and body weight      follow up: prn

## 2018-06-16 ENCOUNTER — Encounter: Payer: Self-pay | Admitting: Primary Care

## 2018-06-16 ENCOUNTER — Ambulatory Visit (INDEPENDENT_AMBULATORY_CARE_PROVIDER_SITE_OTHER): Payer: No Typology Code available for payment source | Admitting: Primary Care

## 2018-06-16 VITALS — BP 106/70 | HR 59 | Temp 98.0°F | Ht 64.0 in | Wt 185.2 lb

## 2018-06-16 DIAGNOSIS — K649 Unspecified hemorrhoids: Secondary | ICD-10-CM

## 2018-06-16 DIAGNOSIS — K219 Gastro-esophageal reflux disease without esophagitis: Secondary | ICD-10-CM

## 2018-06-16 DIAGNOSIS — Z1239 Encounter for other screening for malignant neoplasm of breast: Secondary | ICD-10-CM | POA: Diagnosis not present

## 2018-06-16 DIAGNOSIS — N938 Other specified abnormal uterine and vaginal bleeding: Secondary | ICD-10-CM

## 2018-06-16 DIAGNOSIS — Z0000 Encounter for general adult medical examination without abnormal findings: Secondary | ICD-10-CM | POA: Diagnosis not present

## 2018-06-16 DIAGNOSIS — B001 Herpesviral vesicular dermatitis: Secondary | ICD-10-CM

## 2018-06-16 DIAGNOSIS — R7303 Prediabetes: Secondary | ICD-10-CM

## 2018-06-16 NOTE — Assessment & Plan Note (Signed)
Doing well on Zantac PRN. Continue same. 

## 2018-06-16 NOTE — Assessment & Plan Note (Signed)
Immunizations UTD. Pap smear UTD. Mammogram due, pending. Discussed the importance of a healthy diet and regular exercise in order for weight loss, and to reduce the risk of any potential medical problems. Exam unremarkable. Labs pending. Follow up in 1 year for CPE. 

## 2018-06-16 NOTE — Progress Notes (Signed)
Subjective:    Patient ID: Kelly Sparks, female    DOB: [birth year], 42 y.o.   MRN: [id number]  HPI  Kelly Sparks is a 42 year old female who presents today for complete physical.  Immunizations: -Tetanus: Completed in 2013 -Influenza: Completed this season   Diet: She endorses a fair diet. Breakfast: Waffle, smoothie, cereal  Lunch: Sandwich, take out food, fast food Dinner: Meat, vegetables Snacks: Crackers, candy Desserts: 3-4 days weekly  Beverages: Water, sugar free lemonade, coffee  Exercise: She is walking some, no regular exercise Eye exam: Completed several years ago Dental exam: Completes semi-annually  Pap Smear: Completed in 2018 Mammogram: Completed in 2018   Review of Systems  Constitutional: Negative for unexpected weight change.  HENT: Negative for rhinorrhea.   Respiratory: Negative for cough and shortness of breath.   Cardiovascular: Negative for chest pain.  Gastrointestinal: Negative for constipation and diarrhea.  Genitourinary: Negative for difficulty urinating and menstrual problem.  Musculoskeletal: Negative for arthralgias and myalgias.  Skin: Negative for rash.  Allergic/Immunologic: Negative for environmental allergies.  Neurological: Negative for dizziness, numbness and headaches.  Psychiatric/Behavioral: The patient is not nervous/anxious.        Past Medical History:  Diagnosis Date  . Acid reflux   . Alopecia   . BRCA negative 05/2017   BRCA/MyRisk except POLE VUS  . Family history of breast cancer 05/2017   MyRisk neg; IBIS=14%  . Hemorrhoids   . Rectal bleeding 02/10/2013     Social History   Socioeconomic History  . Marital status: Married    Spouse name: Not on file  . Number of children: Not on file  . Years of education: Not on file  . Highest education level: Not on file  Occupational History  . Not on file  Social Needs  . Financial resource strain: Not on file  . Food insecurity:    Worry: Not on file   Inability: Not on file  . Transportation needs:    Medical: Not on file    Non-medical: Not on file  Tobacco Use  . Smoking status: Never Smoker  . Smokeless tobacco: Never Used  Substance and Sexual Activity  . Alcohol use: Yes    Alcohol/week: 1.0 standard drinks    Types: 1 Standard drinks or equivalent per week    Comment: occasional - 1 mixed drink/mo  . Drug use: No  . Sexual activity: Yes    Birth control/protection: Surgical    Comment: vasectomy  Lifestyle  . Physical activity:    Days per week: Not on file    Minutes per session: Not on file  . Stress: Not on file  Relationships  . Social connections:    Talks on phone: Not on file    Gets together: Not on file    Attends religious service: Not on file    Active member of club or organization: Not on file    Attends meetings of clubs or organizations: Not on file    Relationship status: Not on file  . Intimate partner violence:    Fear of current or ex partner: Not on file    Emotionally abused: Not on file    Physically abused: Not on file    Forced sexual activity: Not on file  Other Topics Concern  . Not on file  Social History Narrative  . Not on file    Past Surgical History:  Procedure Laterality Date  . NO PAST SURGERIES    .  TONSILLECTOMY Bilateral 10/17/2015   Procedure: TONSILLECTOMY;  Surgeon: Carloyn Manner, MD;  Location: [facility];  Service: ENT;  Laterality: Bilateral;    Family History  Problem Relation Age of Onset  . Breast cancer Maternal Aunt 30  . Uterine cancer Maternal Aunt   . Cancer Father        lung  . Hyperlipidemia Father   . Hypertension Father   . Hyperlipidemia Mother   . Hypertension Mother   . Diabetes Maternal Uncle   . Breast cancer Paternal Aunt 74    No Known Allergies  Current Outpatient Medications on File Prior to Visit  Medication Sig Dispense Refill  . betamethasone dipropionate (DIPROLENE) 0.05 % ointment Apply 1 application topically  daily.  5  . clobetasol ointment (TEMOVATE) 6.83 % Apply 1 application topically as needed.    . Levonorgestrel (KYLEENA) 19.5 MG IUD by Intrauterine route.    . Multiple Vitamin (MULTIVITAMIN) tablet Take 1 tablet by mouth daily.    . polyethylene glycol powder (GLYCOLAX/MIRALAX) powder Take 17 g by mouth daily as needed.    . Probiotic Product (PROBIOTIC-10) CAPS Take 1 tablet by mouth daily.    . ranitidine (ZANTAC) 150 MG tablet Take 1 tablet (150 mg total) by mouth 2 (two) times daily. (Patient taking differently: Take 150 mg by mouth 2 (two) times daily as needed. ) 180 tablet 0  . valACYclovir (VALTREX) 500 MG tablet Take 1 tablet (500 mg total) by mouth 2 (two) times daily as needed. 30 tablet 2  . hydrocortisone (ANUSOL-HC) 25 MG suppository Place 1 suppository (25 mg total) rectally 2 (two) times daily. (Patient not taking: Reported on 12/09/2017) 12 suppository 0   No current facility-administered medications on file prior to visit.     BP 106/70   Pulse (!) 59   Temp 98 F (36.7 C) (Oral)   Ht 5' 4"  (1.626 m)   Wt 185 lb 4 oz (84 kg)   SpO2 98%   BMI 31.80 kg/m    Objective:   Physical Exam  Constitutional: She is oriented to person, place, and time. She appears well-nourished.  HENT:  Mouth/Throat: No oropharyngeal exudate.  Eyes: Pupils are equal, round, and reactive to light. EOM are normal.  Neck: Neck supple. No thyromegaly present.  Cardiovascular: Normal rate and regular rhythm.  Respiratory: Effort normal and breath sounds normal.  GI: Soft. Bowel sounds are normal. There is no tenderness.  Musculoskeletal: Normal range of motion.  Neurological: She is alert and oriented to person, place, and time.  Skin: Skin is warm and dry.  Psychiatric: She has a normal mood and affect.           Assessment & Plan:

## 2018-06-16 NOTE — Patient Instructions (Signed)
Start exercising. You should be getting 150 minutes of moderate intensity exercise weekly.  It's important to improve your diet by reducing consumption of fast food, fried food, processed snack foods, sugary drinks. Increase consumption of fresh vegetables and fruits, whole grains, water.  Ensure you are drinking 64 ounces of water daily.  Schedule a lab only appointment or have labs drawn at work. You should be fasting 4 hours prior to labs.  It was a pleasure to see you today!   Preventive Care 40-64 Years, Female Preventive care refers to lifestyle choices and visits with your health care provider that can promote health and wellness. What does preventive care include?  A yearly physical exam. This is also called an annual well check.  Dental exams once or twice a year.  Routine eye exams. Ask your health care provider how often you should have your eyes checked.  Personal lifestyle choices, including: ? Daily care of your teeth and gums. ? Regular physical activity. ? Eating a healthy diet. ? Avoiding tobacco and drug use. ? Limiting alcohol use. ? Practicing safe sex. ? Taking low-dose aspirin daily starting at age 6. ? Taking vitamin and mineral supplements as recommended by your health care provider. What happens during an annual well check? The services and screenings done by your health care provider during your annual well check will depend on your age, overall health, lifestyle risk factors, and family history of disease. Counseling Your health care provider may ask you questions about your:  Alcohol use.  Tobacco use.  Drug use.  Emotional well-being.  Home and relationship well-being.  Sexual activity.  Eating habits.  Work and work Statistician.  Method of birth control.  Menstrual cycle.  Pregnancy history.  Screening You may have the following tests or measurements:  Height, weight, and BMI.  Blood pressure.  Lipid and cholesterol  levels. These may be checked every 5 years, or more frequently if you are over 61 years old.  Skin check.  Lung cancer screening. You may have this screening every year starting at age 26 if you have a 30-pack-year history of smoking and currently smoke or have quit within the past 15 years.  Fecal occult blood test (FOBT) of the stool. You may have this test every year starting at age 67.  Flexible sigmoidoscopy or colonoscopy. You may have a sigmoidoscopy every 5 years or a colonoscopy every 10 years starting at age 16.  Hepatitis C blood test.  Hepatitis B blood test.  Sexually transmitted disease (STD) testing.  Diabetes screening. This is done by checking your blood sugar (glucose) after you have not eaten for a while (fasting). You may have this done every 1-3 years.  Mammogram. This may be done every 1-2 years. Talk to your health care provider about when you should start having regular mammograms. This may depend on whether you have a family history of breast cancer.  BRCA-related cancer screening. This may be done if you have a family history of breast, ovarian, tubal, or peritoneal cancers.  Pelvic exam and Pap test. This may be done every 3 years starting at age 70. Starting at age 8, this may be done every 5 years if you have a Pap test in combination with an HPV test.  Bone density scan. This is done to screen for osteoporosis. You may have this scan if you are at high risk for osteoporosis.  Discuss your test results, treatment options, and if necessary, the need for more tests with  your health care provider. Vaccines Your health care provider may recommend certain vaccines, such as:  Influenza vaccine. This is recommended every year.  Tetanus, diphtheria, and acellular pertussis (Tdap, Td) vaccine. You may need a Td booster every 10 years.  Varicella vaccine. You may need this if you have not been vaccinated.  Zoster vaccine. You may need this after age  72.  Measles, mumps, and rubella (MMR) vaccine. You may need at least one dose of MMR if you were born in 1957 or later. You may also need a second dose.  Pneumococcal 13-valent conjugate (PCV13) vaccine. You may need this if you have certain conditions and were not previously vaccinated.  Pneumococcal polysaccharide (PPSV23) vaccine. You may need one or two doses if you smoke cigarettes or if you have certain conditions.  Meningococcal vaccine. You may need this if you have certain conditions.  Hepatitis A vaccine. You may need this if you have certain conditions or if you travel or work in places where you may be exposed to hepatitis A.  Hepatitis B vaccine. You may need this if you have certain conditions or if you travel or work in places where you may be exposed to hepatitis B.  Haemophilus influenzae type b (Hib) vaccine. You may need this if you have certain conditions.  Talk to your health care provider about which screenings and vaccines you need and how often you need them. This information is not intended to replace advice given to you by your health care provider. Make sure you discuss any questions you have with your health care provider. Document Released: 09/14/2015 Document Revised: 05/07/2016 Document Reviewed: 06/19/2015 Elsevier Interactive Patient Education  [employer].

## 2018-06-16 NOTE — Assessment & Plan Note (Signed)
Repeat A1C pending.  Discussed the importance of a healthy diet and regular exercise in order for weight loss, and to reduce the risk of any potential medical problems.  

## 2018-06-16 NOTE — Assessment & Plan Note (Signed)
Improved since IUD placement. Following with GYN.

## 2018-06-16 NOTE — Assessment & Plan Note (Signed)
Managed on valacyclovir PRN. Last outbreak was 7 months ago.

## 2018-06-16 NOTE — Assessment & Plan Note (Signed)
No recent hemorrhoids. Taking miralax to prevent constipation.

## 2018-06-29 ENCOUNTER — Other Ambulatory Visit: Payer: Self-pay

## 2018-06-29 DIAGNOSIS — R7303 Prediabetes: Secondary | ICD-10-CM

## 2018-06-29 DIAGNOSIS — Z0000 Encounter for general adult medical examination without abnormal findings: Secondary | ICD-10-CM

## 2018-06-29 NOTE — Telephone Encounter (Signed)
Please change to quest.

## 2018-07-02 LAB — COMPREHENSIVE METABOLIC PANEL
AG RATIO: 1.8 (calc) (ref 1.0–2.5)
ALBUMIN MSPROF: 4.3 g/dL (ref 3.6–5.1)
ALKALINE PHOSPHATASE (APISO): 65 U/L (ref 33–115)
ALT: 10 U/L (ref 6–29)
AST: 16 U/L (ref 10–30)
BUN: 13 mg/dL (ref 7–25)
CHLORIDE: 105 mmol/L (ref 98–110)
CO2: 27 mmol/L (ref 20–32)
Calcium: 9.1 mg/dL (ref 8.6–10.2)
Creat: 0.74 mg/dL (ref 0.50–1.10)
Globulin: 2.4 g/dL (calc) (ref 1.9–3.7)
Glucose, Bld: 94 mg/dL (ref 65–99)
POTASSIUM: 4.5 mmol/L (ref 3.5–5.3)
Sodium: 138 mmol/L (ref 135–146)
Total Bilirubin: 0.5 mg/dL (ref 0.2–1.2)
Total Protein: 6.7 g/dL (ref 6.1–8.1)

## 2018-07-02 LAB — LIPID PANEL
CHOLESTEROL: 162 mg/dL (ref <200)
HDL: 59 mg/dL (ref >50)
LDL Cholesterol (Calc): 92 mg/dL (calc)
Non-HDL Cholesterol (Calc): 103 mg/dL (calc) (ref <130)
TRIGLYCERIDES: 38 mg/dL (ref <150)
Total CHOL/HDL Ratio: 2.7 (calc) (ref <5.0)

## 2018-07-02 LAB — HEMOGLOBIN A1C
Hgb A1c MFr Bld: 5.8 % of total Hgb — ABNORMAL HIGH (ref <5.7)
Mean Plasma Glucose: 120 (calc)
eAG (mmol/L): 6.6 (calc)

## 2018-07-19 ENCOUNTER — Telehealth: Payer: No Typology Code available for payment source | Admitting: Family

## 2018-07-19 DIAGNOSIS — M546 Pain in thoracic spine: Secondary | ICD-10-CM

## 2018-07-19 MED ORDER — NAPROXEN 500 MG PO TABS: 500.0000 mg | ORAL_TABLET | Freq: Two times a day (BID) | ORAL | 0 refills | Status: DC

## 2018-07-19 MED ORDER — BACLOFEN 10 MG PO TABS: 10.0000 mg | ORAL_TABLET | Freq: Three times a day (TID) | ORAL | 0 refills | Status: DC

## 2018-07-19 NOTE — Progress Notes (Signed)

## 2018-11-09 ENCOUNTER — Encounter: Payer: Self-pay | Admitting: Radiology

## 2019-01-15 ENCOUNTER — Other Ambulatory Visit: Payer: Self-pay

## 2019-01-15 DIAGNOSIS — K219 Gastro-esophageal reflux disease without esophagitis: Secondary | ICD-10-CM

## 2019-01-18 ENCOUNTER — Other Ambulatory Visit: Payer: Self-pay

## 2019-01-18 ENCOUNTER — Ambulatory Visit (INDEPENDENT_AMBULATORY_CARE_PROVIDER_SITE_OTHER): Payer: No Typology Code available for payment source | Admitting: Primary Care

## 2019-01-18 ENCOUNTER — Encounter: Payer: Self-pay | Admitting: Primary Care

## 2019-01-18 VITALS — BP 122/82 | HR 60 | Temp 98.2°F | Ht 64.0 in | Wt 194.5 lb

## 2019-01-18 DIAGNOSIS — R229 Localized swelling, mass and lump, unspecified: Secondary | ICD-10-CM | POA: Diagnosis not present

## 2019-01-18 DIAGNOSIS — K219 Gastro-esophageal reflux disease without esophagitis: Secondary | ICD-10-CM

## 2019-01-18 MED ORDER — FAMOTIDINE 20 MG PO TABS: 20.0000 mg | ORAL_TABLET | Freq: Two times a day (BID) | ORAL | 0 refills | Status: DC

## 2019-01-18 NOTE — Telephone Encounter (Signed)
Noted, changed to Pepcid and sent to pharmacy.

## 2019-01-18 NOTE — Patient Instructions (Signed)
Stop by the front desk and speak with either [facility] regarding your ultrasound.  I will switch you from ranitidine to famotidine for heartburn symptoms.   It was a pleasure to see you today!

## 2019-01-18 NOTE — Assessment & Plan Note (Signed)
Managed on Zantac which was recalled. Rx for Pepcid will be sent to pharmacy.

## 2019-01-18 NOTE — Telephone Encounter (Signed)
Per patient: please change to different prescription  Last seen on 06/16/2018. Patient has an appt on 01/18/2019

## 2019-01-18 NOTE — Assessment & Plan Note (Signed)
Located to right cheek. Seems to be lipoma or benign cyst based off of exam. Doesn't appear to be abscess or cellulitis.   Ultrasound order placed for further evaluation. Consider dermatology referral if needed.

## 2019-01-18 NOTE — Progress Notes (Signed)
Subjective:    Patient ID: Kelly Sparks, female    DOB: 14-Feb-1976, 43 y.o.   MRN: [id number]  HPI  Kelly Sparks is a 43 year old female with a history of facial acne who presents today with a chief complaint of skin mass. She is also managed on Zantac PRN for GERD which was recently recalled, she is needing an alternative.  The mass is located to the subcutaneous right cheek for which she noticed 2 weeks ago. She denies pain, erythema. She has noticed increase in size of the mass from a pea to the size of her fingertip. She's applied OTC acne medication and warm compresses without improvement.   Review of Systems  Constitutional: Negative for fever.  Gastrointestinal:       Intermittent GERD  Skin: Negative for color change and wound.       Skin mass       Past Medical History:  Diagnosis Date  . Acid reflux   . Alopecia   . BRCA negative 05/2017   BRCA/MyRisk except POLE VUS  . Family history of breast cancer 05/2017   MyRisk neg; IBIS=14%  . Hemorrhoids   . Rectal bleeding 02/10/2013     Social History   Socioeconomic History  . Marital status: Married    Spouse name: Not on file  . Number of children: Not on file  . Years of education: Not on file  . Highest education level: Not on file  Occupational History  . Not on file  Social Needs  . Financial resource strain: Not on file  . Food insecurity:    Worry: Not on file    Inability: Not on file  . Transportation needs:    Medical: Not on file    Non-medical: Not on file  Tobacco Use  . Smoking status: Never Smoker  . Smokeless tobacco: Never Used  Substance and Sexual Activity  . Alcohol use: Yes    Alcohol/week: 1.0 standard drinks    Types: 1 Standard drinks or equivalent per week    Comment: occasional - 1 mixed drink/mo  . Drug use: No  . Sexual activity: Yes    Birth control/protection: Surgical    Comment: vasectomy  Lifestyle  . Physical activity:    Days per week: Not on file    Minutes  per session: Not on file  . Stress: Not on file  Relationships  . Social connections:    Talks on phone: Not on file    Gets together: Not on file    Attends religious service: Not on file    Active member of club or organization: Not on file    Attends meetings of clubs or organizations: Not on file    Relationship status: Not on file  . Intimate partner violence:    Fear of current or ex partner: Not on file    Emotionally abused: Not on file    Physically abused: Not on file    Forced sexual activity: Not on file  Other Topics Concern  . Not on file  Social History Narrative  . Not on file    Past Surgical History:  Procedure Laterality Date  . NO PAST SURGERIES    . TONSILLECTOMY Bilateral 10/17/2015   Procedure: TONSILLECTOMY;  Surgeon: Carloyn Manner, MD;  Location: [facility];  Service: ENT;  Laterality: Bilateral;    Family History  Problem Relation Age of Onset  . Breast cancer Maternal Aunt 30  . Uterine cancer  Maternal Aunt   . Cancer Father        lung  . Hyperlipidemia Father   . Hypertension Father   . Hyperlipidemia Mother   . Hypertension Mother   . Diabetes Maternal Uncle   . Breast cancer Paternal Aunt 86    No Known Allergies  Current Outpatient Medications on File Prior to Visit  Medication Sig Dispense Refill  . baclofen (LIORESAL) 10 MG tablet Take 1 tablet (10 mg total) by mouth 3 (three) times daily. 30 each 0  . betamethasone dipropionate (DIPROLENE) 0.05 % ointment Apply 1 application topically daily.  5  . clobetasol ointment (TEMOVATE) 5.17 % Apply 1 application topically as needed.    . Levonorgestrel (KYLEENA) 19.5 MG IUD by Intrauterine route.    . Multiple Vitamin (MULTIVITAMIN) tablet Take 1 tablet by mouth daily.    . naproxen (NAPROSYN) 500 MG tablet Take 1 tablet (500 mg total) by mouth 2 (two) times daily with a meal. 30 tablet 0  . polyethylene glycol powder (GLYCOLAX/MIRALAX) powder Take 17 g by mouth daily as  needed.    . Probiotic Product (PROBIOTIC-10) CAPS Take 1 tablet by mouth daily.    . valACYclovir (VALTREX) 500 MG tablet Take 1 tablet (500 mg total) by mouth 2 (two) times daily as needed. 30 tablet 2  . hydrocortisone (ANUSOL-HC) 25 MG suppository Place 1 suppository (25 mg total) rectally 2 (two) times daily. (Patient not taking: Reported on 01/18/2019) 12 suppository 0   No current facility-administered medications on file prior to visit.     BP 122/82   Pulse 60   Temp 98.2 F (36.8 C) (Oral)   Ht 5' 4"  (1.626 m)   Wt 194 lb 8 oz (88.2 kg)   SpO2 98%   BMI 33.39 kg/m    Objective:   Physical Exam  Constitutional: She appears well-nourished.  Respiratory: Effort normal.  Skin: Skin is warm and dry. No erythema.  1 cm round, soft, immobile mass to right cheek. No erythema. Non tender.           Assessment & Plan:

## 2019-02-03 ENCOUNTER — Ambulatory Visit: Payer: No Typology Code available for payment source

## 2019-03-24 ENCOUNTER — Other Ambulatory Visit: Payer: Self-pay | Admitting: Primary Care

## 2019-03-24 DIAGNOSIS — Z20822 Contact with and (suspected) exposure to covid-19: Secondary | ICD-10-CM

## 2019-06-13 ENCOUNTER — Telehealth: Payer: No Typology Code available for payment source | Admitting: Physician Assistant

## 2019-06-13 DIAGNOSIS — M549 Dorsalgia, unspecified: Secondary | ICD-10-CM | POA: Diagnosis not present

## 2019-06-13 MED ORDER — NAPROXEN 500 MG PO TABS: 500.0000 mg | ORAL_TABLET | Freq: Two times a day (BID) | ORAL | 0 refills | Status: DC

## 2019-06-13 MED ORDER — CYCLOBENZAPRINE HCL 10 MG PO TABS: 10.0000 mg | ORAL_TABLET | Freq: Three times a day (TID) | ORAL | 0 refills | Status: DC | PRN

## 2019-06-13 NOTE — Progress Notes (Signed)
We are sorry that you are not feeling well.  Here is how we plan to help!  Based on what you have shared with me it looks like you mostly have acute back pain.  Acute back pain is defined as musculoskeletal pain that can resolve in 1-3 weeks with conservative treatment.  I have prescribed Naprosyn 500 mg twice a day non-steroid anti-inflammatory (NSAID) as well as Flexeril 10 mg every eight hours as needed which is a muscle relaxer  Some patients experience stomach irritation or in increased heartburn with anti-inflammatory drugs.  Please keep in mind that muscle relaxer's can cause fatigue and should not be taken while at work or driving.  Back pain is very common.  The pain often gets better over time.  The cause of back pain is usually not dangerous.  Most people can learn to manage their back pain on their own.  Be sure to stretch twice daily.   Home Care  Stay active.  Start with short walks on flat ground if you can.  Try to walk farther each day.  Do not sit, drive or stand in one place for more than 30 minutes.  Do not stay in bed.  Do not avoid exercise or work.  Activity can help your back heal faster.  Be careful when you bend or lift an object.  Bend at your knees, keep the object close to you, and do not twist.  Sleep on a firm mattress.  Lie on your side, and bend your knees.  If you lie on your back, put a pillow under your knees.  Only take medicines as told by your doctor.  Put ice on the injured area.  Put ice in a plastic bag  Place a towel between your skin and the bag  Leave the ice on for 15-20 minutes, 3-4 times a day for the first 2-3 days. 210 After that, you can switch between ice and heat packs.  Ask your doctor about back exercises or massage.  Avoid feeling anxious or stressed.  Find good ways to deal with stress, such as exercise.  Get Help Right Way If:  Your pain does not go away with rest or medicine.  Your pain does not go away in 1 week.  You  have new problems.  You do not feel well.  The pain spreads into your legs.  You cannot control when you poop (bowel movement) or pee (urinate)  You feel sick to your stomach (nauseous) or throw up (vomit)  You have belly (abdominal) pain.  You feel like you may pass out (faint).  If you develop a fever.  Make Sure you:  Understand these instructions.  Will watch your condition  Will get help right away if you are not doing well or get worse.  Your e-visit answers were reviewed by a board certified advanced clinical practitioner to complete your personal care plan.  Depending on the condition, your plan could have included both over the counter or prescription medications.  If there is a problem please reply  once you have received a response from your provider.  Your safety is important to [place].  If you have drug allergies check your prescription carefully.    You can use MyChart to ask questions about today's visit, request a non-urgent call back, or ask for a work or school excuse for 24 hours related to this e-Visit. If it has been greater than 24 hours you will need to follow up with your  provider, or enter a new e-Visit to address those concerns.  You will get an e-mail in the next two days asking about your experience.  I hope that your e-visit has been valuable and will speed your recovery. Thank you for using e-visits.  Greater than 5 minutes, yet less than 10 minutes of time have been spent researching, coordinating and implementing care for this patient today.

## 2019-06-20 ENCOUNTER — Encounter: Payer: No Typology Code available for payment source | Admitting: Primary Care

## 2019-07-06 ENCOUNTER — Encounter: Payer: Self-pay | Admitting: Primary Care

## 2019-07-06 ENCOUNTER — Other Ambulatory Visit: Payer: Self-pay | Admitting: Primary Care

## 2019-07-06 ENCOUNTER — Other Ambulatory Visit: Payer: Self-pay

## 2019-07-06 ENCOUNTER — Ambulatory Visit (INDEPENDENT_AMBULATORY_CARE_PROVIDER_SITE_OTHER): Payer: No Typology Code available for payment source | Admitting: Primary Care

## 2019-07-06 VITALS — BP 116/80 | HR 95 | Temp 97.2°F | Ht 64.0 in | Wt 182.8 lb

## 2019-07-06 DIAGNOSIS — Z803 Family history of malignant neoplasm of breast: Secondary | ICD-10-CM

## 2019-07-06 DIAGNOSIS — R7303 Prediabetes: Secondary | ICD-10-CM

## 2019-07-06 DIAGNOSIS — Z8619 Personal history of other infectious and parasitic diseases: Secondary | ICD-10-CM

## 2019-07-06 DIAGNOSIS — K219 Gastro-esophageal reflux disease without esophagitis: Secondary | ICD-10-CM

## 2019-07-06 DIAGNOSIS — R748 Abnormal levels of other serum enzymes: Secondary | ICD-10-CM

## 2019-07-06 DIAGNOSIS — Z0000 Encounter for general adult medical examination without abnormal findings: Secondary | ICD-10-CM | POA: Diagnosis not present

## 2019-07-06 DIAGNOSIS — K649 Unspecified hemorrhoids: Secondary | ICD-10-CM

## 2019-07-06 DIAGNOSIS — Z1231 Encounter for screening mammogram for malignant neoplasm of breast: Secondary | ICD-10-CM | POA: Diagnosis not present

## 2019-07-06 DIAGNOSIS — F411 Generalized anxiety disorder: Secondary | ICD-10-CM

## 2019-07-06 DIAGNOSIS — B001 Herpesviral vesicular dermatitis: Secondary | ICD-10-CM

## 2019-07-06 LAB — POC HEMOCCULT BLD/STL (OFFICE/1-CARD/DIAGNOSTIC): Fecal Occult Blood, POC: NEGATIVE

## 2019-07-06 MED ORDER — FAMOTIDINE 20 MG PO TABS: 20.0000 mg | ORAL_TABLET | Freq: Two times a day (BID) | ORAL | 0 refills | Status: DC

## 2019-07-06 MED ORDER — VALACYCLOVIR HCL 1 G PO TABS: ORAL_TABLET | ORAL | 0 refills | Status: DC

## 2019-07-06 NOTE — Assessment & Plan Note (Signed)
Doing well on famotidine PRN, also tries to avoid triggers. Continue current regimen.

## 2019-07-06 NOTE — Assessment & Plan Note (Addendum)
No recent hemorrhoid but did have bout of loose stool with bright red rectal bleeding 2 days ago, no bleeding since.  Hemoccult stool card negative today. Small external and internal hemorrhoid noted on exam. Discussed to notify if symptoms reoccur. No family history of Colon cancer.

## 2019-07-06 NOTE — Assessment & Plan Note (Signed)
Repeat CMP pending. 

## 2019-07-06 NOTE — Assessment & Plan Note (Signed)
Repeat A1C pending.  Discussed the importance of a healthy diet and regular exercise in order for weight loss, and to reduce the risk of any potential medical problems.  

## 2019-07-06 NOTE — Assessment & Plan Note (Signed)
Infrequent breakouts overall, trying to work on stress reduction. Continue Valrex PRN.

## 2019-07-06 NOTE — Patient Instructions (Signed)
Start exercising. You should be getting 150 minutes of moderate intensity exercise weekly.  It's important to improve your diet by reducing consumption of fast food, fried food, processed snack foods, sugary drinks. Increase consumption of fresh vegetables and fruits, whole grains, water.  Ensure you are drinking 64 ounces of water daily.  Call the breast center to schedule your mammogram.  Schedule a lab only appointment for your fasting labs.  It was a pleasure to see you today!   Preventive Care 43-26 Years Old, Female Preventive care refers to visits with your health care provider and lifestyle choices that can promote health and wellness. This includes:  A yearly physical exam. This may also be called an annual well check.  Regular dental visits and eye exams.  Immunizations.  Screening for certain conditions.  Healthy lifestyle choices, such as eating a healthy diet, getting regular exercise, not using drugs or products that contain nicotine and tobacco, and limiting alcohol use. What can I expect for my preventive care visit? Physical exam Your health care provider will check your:  Height and weight. This may be used to calculate body mass index (BMI), which tells if you are at a healthy weight.  Heart rate and blood pressure.  Skin for abnormal spots. Counseling Your health care provider may ask you questions about your:  Alcohol, tobacco, and drug use.  Emotional well-being.  Home and relationship well-being.  Sexual activity.  Eating habits.  Work and work Statistician.  Method of birth control.  Menstrual cycle.  Pregnancy history. What immunizations do I need?  Influenza (flu) vaccine  This is recommended every year. Tetanus, diphtheria, and pertussis (Tdap) vaccine  You may need a Td booster every 10 years. Varicella (chickenpox) vaccine  You may need this if you have not been vaccinated. Zoster (shingles) vaccine  You may need this  after age 43. Measles, mumps, and rubella (MMR) vaccine  You may need at least one dose of MMR if you were born in 1957 or later. You may also need a second dose. Pneumococcal conjugate (PCV13) vaccine  You may need this if you have certain conditions and were not previously vaccinated. Pneumococcal polysaccharide (PPSV23) vaccine  You may need one or two doses if you smoke cigarettes or if you have certain conditions. Meningococcal conjugate (MenACWY) vaccine  You may need this if you have certain conditions. Hepatitis A vaccine  You may need this if you have certain conditions or if you travel or work in places where you may be exposed to hepatitis A. Hepatitis B vaccine  You may need this if you have certain conditions or if you travel or work in places where you may be exposed to hepatitis B. Haemophilus influenzae type b (Hib) vaccine  You may need this if you have certain conditions. Human papillomavirus (HPV) vaccine  If recommended by your health care provider, you may need three doses over 6 months. You may receive vaccines as individual doses or as more than one vaccine together in one shot (combination vaccines). Talk with your health care provider about the risks and benefits of combination vaccines. What tests do I need? Blood tests  Lipid and cholesterol levels. These may be checked every 5 years, or more frequently if you are over 45 years old.  Hepatitis C test.  Hepatitis B test. Screening  Lung cancer screening. You may have this screening every year starting at age 84 if you have a 30-pack-year history of smoking and currently smoke or have  quit within the past 15 years.  Colorectal cancer screening. All adults should have this screening starting at age 73 and continuing until age 70. Your health care provider may recommend screening at age 31 if you are at increased risk. You will have tests every 1-10 years, depending on your results and the type of  screening test.  Diabetes screening. This is done by checking your blood sugar (glucose) after you have not eaten for a while (fasting). You may have this done every 1-3 years.  Mammogram. This may be done every 1-2 years. Talk with your health care provider about when you should start having regular mammograms. This may depend on whether you have a family history of breast cancer.  BRCA-related cancer screening. This may be done if you have a family history of breast, ovarian, tubal, or peritoneal cancers.  Pelvic exam and Pap test. This may be done every 3 years starting at age 68. Starting at age 47, this may be done every 5 years if you have a Pap test in combination with an HPV test. Other tests  Sexually transmitted disease (STD) testing.  Bone density scan. This is done to screen for osteoporosis. You may have this scan if you are at high risk for osteoporosis. Follow these instructions at home: Eating and drinking  Eat a diet that includes fresh fruits and vegetables, whole grains, lean protein, and low-fat dairy.  Take vitamin and mineral supplements as recommended by your health care provider.  Do not drink alcohol if: ? Your health care provider tells you not to drink. ? You are pregnant, may be pregnant, or are planning to become pregnant.  If you drink alcohol: ? Limit how much you have to 0-1 drink a day. ? Be aware of how much alcohol is in your drink. In the [place] one drink equals one 12 oz bottle of beer (355 mL), one 5 oz glass of wine (148 mL), or one 1 oz glass of hard liquor (44 mL). Lifestyle  Take daily care of your teeth and gums.  Stay active. Exercise for at least 30 minutes on 5 or more days each week.  Do not use any products that contain nicotine or tobacco, such as cigarettes, e-cigarettes, and chewing tobacco. If you need help quitting, ask your health care provider.  If you are sexually active, practice safe sex. Use a condom or other form of birth  control (contraception) in order to prevent pregnancy and STIs (sexually transmitted infections).  If told by your health care provider, take low-dose aspirin daily starting at age 21. What's next?  Visit your health care provider once a year for a well check visit.  Ask your health care provider how often you should have your eyes and teeth checked.  Stay up to date on all vaccines. This information is not intended to replace advice given to you by your health care provider. Make sure you discuss any questions you have with your health care provider. Document Released: 09/14/2015 Document Revised: 04/29/2018 Document Reviewed: 04/29/2018 Elsevier Patient Education  2020 [employer].

## 2019-07-06 NOTE — Progress Notes (Signed)
Subjective:    Patient ID: Kelly Sparks, female    DOB: October 16, 1975, 43 y.o.   MRN: [id number]  HPI  Kelly Sparks is a 43 year old female who presents today for complete physical.  Immunizations: -Tetanus: Completed in 2013 -Influenza: Completed at work  Diet: She endorses a fair diet. She is eating both take out and home cooked meals. Not as healthy as she'd like.  Exercise: She exercising once weekly.   Eye exam: No recent exam Dental exam: Completes annually   Pap Smear: Completed in 2018, due in 2021 Mammogram: Completed in 2018  BP Readings from Last 3 Encounters:  07/06/19 116/80  01/18/19 122/82  06/16/18 106/70     Review of Systems  Constitutional: Negative for unexpected weight change.  HENT: Negative for rhinorrhea.   Respiratory: Negative for cough and shortness of breath.   Cardiovascular: Negative for chest pain.  Gastrointestinal: Positive for blood in stool. Negative for constipation and diarrhea.  Genitourinary: Negative for difficulty urinating.  Musculoskeletal: Negative for arthralgias and myalgias.  Skin: Negative for rash.  Allergic/Immunologic: Negative for environmental allergies.  Neurological: Negative for dizziness, numbness and headaches.  Psychiatric/Behavioral: The patient is not nervous/anxious.        Past Medical History:  Diagnosis Date  . Acid reflux   . Alopecia   . BRCA negative 05/2017   BRCA/MyRisk except POLE VUS  . Family history of breast cancer 05/2017   MyRisk neg; IBIS=14%  . Hemorrhoids   . Rectal bleeding 02/10/2013     Social History   Socioeconomic History  . Marital status: Married    Spouse name: Not on file  . Number of children: Not on file  . Years of education: Not on file  . Highest education level: Not on file  Occupational History  . Not on file  Social Needs  . Financial resource strain: Not on file  . Food insecurity    Worry: Not on file    Inability: Not on file  . Transportation  needs    Medical: Not on file    Non-medical: Not on file  Tobacco Use  . Smoking status: Never Smoker  . Smokeless tobacco: Never Used  Substance and Sexual Activity  . Alcohol use: Yes    Alcohol/week: 1.0 standard drinks    Types: 1 Standard drinks or equivalent per week    Comment: occasional - 1 mixed drink/mo  . Drug use: No  . Sexual activity: Yes    Birth control/protection: Surgical    Comment: vasectomy  Lifestyle  . Physical activity    Days per week: Not on file    Minutes per session: Not on file  . Stress: Not on file  Relationships  . Social Herbalist on phone: Not on file    Gets together: Not on file    Attends religious service: Not on file    Active member of club or organization: Not on file    Attends meetings of clubs or organizations: Not on file    Relationship status: Not on file  . Intimate partner violence    Fear of current or ex partner: Not on file    Emotionally abused: Not on file    Physically abused: Not on file    Forced sexual activity: Not on file  Other Topics Concern  . Not on file  Social History Narrative  . Not on file    Past Surgical History:  Procedure Laterality  Date  . NO PAST SURGERIES    . TONSILLECTOMY Bilateral 10/17/2015   Procedure: TONSILLECTOMY;  Surgeon: Carloyn Manner, MD;  Location: [facility];  Service: ENT;  Laterality: Bilateral;    Family History  Problem Relation Age of Onset  . Breast cancer Maternal Aunt 30  . Uterine cancer Maternal Aunt   . Cancer Father        lung  . Hyperlipidemia Father   . Hypertension Father   . Hyperlipidemia Mother   . Hypertension Mother   . Diabetes Maternal Uncle   . Breast cancer Paternal Aunt 43    No Known Allergies  Current Outpatient Medications on File Prior to Visit  Medication Sig Dispense Refill  . clobetasol ointment (TEMOVATE) 0.15 % Apply 1 application topically as needed.    . cyclobenzaprine (FLEXERIL) 10 MG tablet Take 1  tablet (10 mg total) by mouth 3 (three) times daily as needed for muscle spasms. 30 tablet 0  . hydrocortisone (ANUSOL-HC) 25 MG suppository Place 1 suppository (25 mg total) rectally 2 (two) times daily. 12 suppository 0  . Levonorgestrel (KYLEENA) 19.5 MG IUD by Intrauterine route.    . Multiple Vitamin (MULTIVITAMIN) tablet Take 1 tablet by mouth daily.    . naproxen (NAPROSYN) 500 MG tablet Take 1 tablet (500 mg total) by mouth 2 (two) times daily with a meal. 30 tablet 0  . polyethylene glycol powder (GLYCOLAX/MIRALAX) powder Take 17 g by mouth daily as needed.    . Probiotic Product (PROBIOTIC-10) CAPS Take 1 tablet by mouth daily.     No current facility-administered medications on file prior to visit.     BP 116/80   Pulse 95   Temp (!) 97.2 F (36.2 C) (Temporal)   Ht 5' 4"  (1.626 m)   Wt 182 lb 12 oz (82.9 kg)   SpO2 98%   BMI 31.37 kg/m    Objective:   Physical Exam  Constitutional: She is oriented to person, place, and time. She appears well-nourished.  HENT:  Right Ear: Tympanic membrane and ear canal normal.  Left Ear: Tympanic membrane and ear canal normal.  Mouth/Throat: Oropharynx is clear and moist.  Eyes: Pupils are equal, round, and reactive to light. EOM are normal.  Neck: Neck supple.  Cardiovascular: Normal rate and regular rhythm.  Respiratory: Effort normal and breath sounds normal.  GI: Soft. Bowel sounds are normal. There is no abdominal tenderness.  Musculoskeletal: Normal range of motion.  Neurological: She is alert and oriented to person, place, and time.  Skin: Skin is warm and dry.  Psychiatric: She has a normal mood and affect.           Assessment & Plan:

## 2019-07-06 NOTE — Assessment & Plan Note (Addendum)
Immunizations UTD. Pap smear UTD. Mammogram due, pending. Encouraged regular exercise and healthy diet. Exam today unremarkable. Labs pending.

## 2019-07-06 NOTE — Assessment & Plan Note (Signed)
Doing well off of mediations, overall able to managed well on her own. Continue off meds.

## 2019-07-06 NOTE — Assessment & Plan Note (Signed)
Repeat mammogram pending.  

## 2019-07-08 ENCOUNTER — Other Ambulatory Visit (INDEPENDENT_AMBULATORY_CARE_PROVIDER_SITE_OTHER): Payer: No Typology Code available for payment source

## 2019-07-08 ENCOUNTER — Other Ambulatory Visit: Payer: Self-pay

## 2019-07-08 DIAGNOSIS — Z0000 Encounter for general adult medical examination without abnormal findings: Secondary | ICD-10-CM | POA: Diagnosis not present

## 2019-07-08 DIAGNOSIS — R748 Abnormal levels of other serum enzymes: Secondary | ICD-10-CM | POA: Diagnosis not present

## 2019-07-08 DIAGNOSIS — R7303 Prediabetes: Secondary | ICD-10-CM | POA: Diagnosis not present

## 2019-07-08 LAB — CBC
HCT: 37.7 % (ref 36.0–46.0)
Hemoglobin: 12.6 g/dL (ref 12.0–15.0)
MCHC: 33.4 g/dL (ref 30.0–36.0)
MCV: 92.5 fl (ref 78.0–100.0)
Platelets: 274 10*3/uL (ref 150.0–400.0)
RBC: 4.08 Mil/uL (ref 3.87–5.11)
RDW: 13 % (ref 11.5–15.5)
WBC: 6.7 10*3/uL (ref 4.0–10.5)

## 2019-07-08 LAB — COMPREHENSIVE METABOLIC PANEL
ALT: 13 U/L (ref 0–35)
AST: 15 U/L (ref 0–37)
Albumin: 4.4 g/dL (ref 3.5–5.2)
Alkaline Phosphatase: 71 U/L (ref 39–117)
BUN: 14 mg/dL (ref 6–23)
CO2: 28 mEq/L (ref 19–32)
Calcium: 9.1 mg/dL (ref 8.4–10.5)
Chloride: 102 mEq/L (ref 96–112)
Creatinine, Ser: 0.82 mg/dL (ref 0.40–1.20)
GFR: 91.91 mL/min (ref >60.00)
Glucose, Bld: 86 mg/dL (ref 70–99)
Potassium: 3.8 mEq/L (ref 3.5–5.1)
Sodium: 136 mEq/L (ref 135–145)
Total Bilirubin: 0.6 mg/dL (ref 0.2–1.2)
Total Protein: 7.2 g/dL (ref 6.0–8.3)

## 2019-07-08 LAB — LIPID PANEL
Cholesterol: 175 mg/dL (ref 0–200)
HDL: 57.6 mg/dL (ref >39.00)
LDL Cholesterol: 109 mg/dL — ABNORMAL HIGH (ref 0–99)
NonHDL: 116.95
Total CHOL/HDL Ratio: 3
Triglycerides: 39 mg/dL (ref 0.0–149.0)
VLDL: 7.8 mg/dL (ref 0.0–40.0)

## 2019-07-08 LAB — HEMOGLOBIN A1C: Hgb A1c MFr Bld: 6 % (ref 4.6–6.5)

## 2019-08-19 ENCOUNTER — Ambulatory Visit
Admission: RE | Admit: 2019-08-19 | Discharge: 2019-08-19 | Disposition: A | Discharge status: Home / Self Care | Payer: No Typology Code available for payment source | Source: Ambulatory Visit | Attending: Primary Care | Admitting: Primary Care

## 2019-08-19 DIAGNOSIS — Z1231 Encounter for screening mammogram for malignant neoplasm of breast: Secondary | ICD-10-CM | POA: Insufficient documentation

## 2019-08-22 ENCOUNTER — Other Ambulatory Visit: Payer: Self-pay | Admitting: Primary Care

## 2019-08-22 DIAGNOSIS — R921 Mammographic calcification found on diagnostic imaging of breast: Secondary | ICD-10-CM

## 2019-08-22 DIAGNOSIS — R928 Other abnormal and inconclusive findings on diagnostic imaging of breast: Secondary | ICD-10-CM

## 2019-09-06 ENCOUNTER — Ambulatory Visit
Admission: RE | Admit: 2019-09-06 | Discharge: 2019-09-06 | Disposition: A | Discharge status: Home / Self Care | Payer: No Typology Code available for payment source | Source: Ambulatory Visit | Attending: Primary Care | Admitting: Primary Care

## 2019-09-06 DIAGNOSIS — R921 Mammographic calcification found on diagnostic imaging of breast: Secondary | ICD-10-CM | POA: Insufficient documentation

## 2019-09-06 DIAGNOSIS — R928 Other abnormal and inconclusive findings on diagnostic imaging of breast: Secondary | ICD-10-CM | POA: Insufficient documentation

## 2019-09-08 DIAGNOSIS — R059 Cough, unspecified: Secondary | ICD-10-CM

## 2019-09-08 DIAGNOSIS — R05 Cough: Secondary | ICD-10-CM

## 2019-09-08 MED ORDER — BENZONATATE 200 MG PO CAPS: 200.0000 mg | ORAL_CAPSULE | Freq: Three times a day (TID) | ORAL | 0 refills | Status: DC | PRN

## 2019-12-28 ENCOUNTER — Encounter: Payer: Self-pay | Admitting: Primary Care

## 2019-12-28 ENCOUNTER — Ambulatory Visit (INDEPENDENT_AMBULATORY_CARE_PROVIDER_SITE_OTHER): Payer: No Typology Code available for payment source | Admitting: Primary Care

## 2019-12-28 ENCOUNTER — Other Ambulatory Visit: Payer: Self-pay

## 2019-12-28 VITALS — BP 104/78 | HR 63 | Temp 97.8°F | Wt 189.5 lb

## 2019-12-28 DIAGNOSIS — K625 Hemorrhage of anus and rectum: Secondary | ICD-10-CM

## 2019-12-28 DIAGNOSIS — K649 Unspecified hemorrhoids: Secondary | ICD-10-CM | POA: Diagnosis not present

## 2019-12-28 LAB — HEMOCCULT GUIAC POC 1CARD (OFFICE)
Card #1 Date: 4282021
Fecal Occult Blood, POC: NEGATIVE

## 2019-12-28 NOTE — Progress Notes (Signed)
Subjective:    Patient ID: Kelly Sparks, female    DOB: 07/08/1976, 44 y.o.   MRN: [id number]  HPI  .This visit occurred during the SARS-CoV-2 public health emergency.  Safety protocols were in place, including screening questions prior to the visit, additional usage of staff PPE, and extensive cleaning of exam room while observing appropriate contact time as indicated for disinfecting solutions.   Kelly Sparks is a 44 year old female with a history of hemorrhoids, GERD, dysfunction uterine bleeding who presents today with a chief complaint of rectal bleeding.  History of hemorrhoids since childbirth 22 years ago, also intermittent rectal bleeding for years that occured only with bowel movements.   Over the last 2-3 months she's noticed an increase in frequency of bright red blood in her stool and on the toilet paper with bowel movements. She doesn't strain with movements as she is taking Miralax on occasion. Bowel movements are daily, rectal bleeding occurs every 2-3 days only occurring with bowel movements. No pain or discomfort on average with movements.   She has a family history of lung cancer in her father, and breast cancer in maternal and paternal aunt. No family history of colon cancer, but both parents have had colonic polyps that had to be removed.   She denies unexplained weight loss, appetite change, night sweats. Her last episode of rectal bleeding was one week ago.   Review of Systems  Constitutional: Negative for appetite change, diaphoresis and unexpected weight change.  Gastrointestinal: Positive for blood in stool. Negative for constipation.       Past Medical History:  Diagnosis Date  . Acid reflux   . Alopecia   . BRCA negative 05/2017   BRCA/MyRisk except POLE VUS  . Family history of breast cancer 05/2017   MyRisk neg; IBIS=14%  . Hemorrhoids   . Rectal bleeding 02/10/2013     Social History   Socioeconomic History  . Marital status: Married   Spouse name: Not on file  . Number of children: Not on file  . Years of education: Not on file  . Highest education level: Not on file  Occupational History  . Not on file  Tobacco Use  . Smoking status: Never Smoker  . Smokeless tobacco: Never Used  Substance and Sexual Activity  . Alcohol use: Yes    Alcohol/week: 1.0 standard drinks    Types: 1 Standard drinks or equivalent per week    Comment: occasional - 1 mixed drink/mo  . Drug use: No  . Sexual activity: Yes    Birth control/protection: Surgical    Comment: vasectomy  Other Topics Concern  . Not on file  Social History Narrative  . Not on file   Social Determinants of Health   Financial Resource Strain:   . Difficulty of Paying Living Expenses:   Food Insecurity:   . Worried About Charity fundraiser in the Last Year:   . Arboriculturist in the Last Year:   Transportation Needs:   . Film/video editor (Medical):   Marland Kitchen Lack of Transportation (Non-Medical):   Physical Activity:   . Days of Exercise per Week:   . Minutes of Exercise per Session:   Stress:   . Feeling of Stress :   Social Connections:   . Frequency of Communication with Friends and Family:   . Frequency of Social Gatherings with Friends and Family:   . Attends Religious Services:   . Active Member of Clubs or  Organizations:   . Attends Archivist Meetings:   Marland Kitchen Marital Status:   Intimate Partner Violence:   . Fear of Current or Ex-Partner:   . Emotionally Abused:   Marland Kitchen Physically Abused:   . Sexually Abused:     Past Surgical History:  Procedure Laterality Date  . NO PAST SURGERIES    . TONSILLECTOMY Bilateral 10/17/2015   Procedure: TONSILLECTOMY;  Surgeon: Carloyn Manner, MD;  Location: [facility];  Service: ENT;  Laterality: Bilateral;    Family History  Problem Relation Age of Onset  . Breast cancer Maternal Aunt 30  . Uterine cancer Maternal Aunt   . Hyperlipidemia Father   . Hypertension Father   . Lung  cancer Father   . Hyperlipidemia Mother   . Hypertension Mother   . Diabetes Maternal Uncle   . Breast cancer Paternal Aunt 27    No Known Allergies  Current Outpatient Medications on File Prior to Visit  Medication Sig Dispense Refill  . clobetasol ointment (TEMOVATE) 3.15 % Apply 1 application topically as needed.    . famotidine (PEPCID) 20 MG tablet Take 1 tablet (20 mg total) by mouth 2 (two) times daily. As needed for heartburn. 180 tablet 0  . hydrocortisone (ANUSOL-HC) 25 MG suppository Place 1 suppository (25 mg total) rectally 2 (two) times daily. 12 suppository 0  . Levonorgestrel (KYLEENA) 19.5 MG IUD by Intrauterine route.    . Multiple Vitamin (MULTIVITAMIN) tablet Take 1 tablet by mouth daily.    . polyethylene glycol powder (GLYCOLAX/MIRALAX) powder Take 17 g by mouth daily as needed.    . Probiotic Product (PROBIOTIC-10) CAPS Take 1 tablet by mouth daily.    . valACYclovir (VALTREX) 1000 MG tablet Take 2 tablets twice daily for one day at cold sore onset. 12 tablet 0   No current facility-administered medications on file prior to visit.    BP 104/78   Pulse 63   Temp 97.8 F (36.6 C)   Wt 189 lb 8 oz (86 kg)   SpO2 100%   BMI 32.53 kg/m    Objective:   Physical Exam  Constitutional: She appears well-nourished.  Respiratory: Effort normal.  Genitourinary: Rectum:     Guaiac result negative.     No rectal mass, external hemorrhoid or internal hemorrhoid.            Assessment & Plan:

## 2019-12-28 NOTE — Patient Instructions (Signed)
You will be contacted regarding your referral to GI.  Please let us know if you have not been contacted within two weeks.   It was a pleasure to see you today!   

## 2019-12-28 NOTE — Addendum Note (Signed)
Addended by: Consuella Lose on: 12/28/2019 04:09 PM   Modules accepted: Orders

## 2019-12-28 NOTE — Assessment & Plan Note (Signed)
Hemorrhoids could be causing increased frequency of rectal bleeding but no obvious hemorrhoid noted today. Negative occult stool card today.  Given increased frequency of bleeding we will send her to GI for evaluation. Referral placed.

## 2019-12-28 NOTE — Assessment & Plan Note (Signed)
Could be causing increased frequency of rectal bleeding but no obvious hemorrhoid noted today. Negative occult stool card today.  Given increased frequency of bleeding we will send her to GI for evaluation. Referral placed.

## 2020-01-03 NOTE — Progress Notes (Signed)
Order(s) created erroneously. Erroneous order ID: [id number]  Order canceled by: Reeves Forth  Order cancel date/time: 01/03/2020 3:39 PM

## 2020-02-01 ENCOUNTER — Ambulatory Visit: Payer: No Typology Code available for payment source | Admitting: Gastroenterology

## 2020-02-08 ENCOUNTER — Ambulatory Visit: Payer: No Typology Code available for payment source | Admitting: Gastroenterology

## 2020-02-08 ENCOUNTER — Other Ambulatory Visit: Payer: Self-pay | Admitting: Primary Care

## 2020-02-08 DIAGNOSIS — R921 Mammographic calcification found on diagnostic imaging of breast: Secondary | ICD-10-CM

## 2020-02-15 ENCOUNTER — Telehealth (INDEPENDENT_AMBULATORY_CARE_PROVIDER_SITE_OTHER): Payer: No Typology Code available for payment source | Admitting: Gastroenterology

## 2020-02-15 DIAGNOSIS — K625 Hemorrhage of anus and rectum: Secondary | ICD-10-CM

## 2020-02-15 DIAGNOSIS — K5909 Other constipation: Secondary | ICD-10-CM | POA: Diagnosis not present

## 2020-02-15 NOTE — Progress Notes (Signed)
Kelly Sparks  [address]  [place]  [place], [place] [address]  Main: [phone]  Fax: [phone]   Gastroenterology Consultation  Referring Provider:     Pleas Koch, NP Primary Care Physician:  Pleas Koch, NP Reason for Consultation:     Rectal bleeding        HPI:   Virtual Visit via video  Note  I connected with patient on 02/15/20 at  2:00 PM EDT by video  and verified that I am speaking with the correct person using two identifiers.   I discussed the limitations, risks, security and privacy concerns of performing an evaluation and management service by video and the availability of in person appointments. I also discussed with the patient that there may be a patient responsible charge related to this service. The patient expressed understanding and agreed to proceed.  Location of the patient: Home Location of provider: Home Participating persons: Patient and provider only   History of Present Illness: Rectal bleeding  Kelly Sparks is a 44 y.o. y/o female referred for consultation & management  by  Pleas Koch, NP.    She has been referred for rectal bleeding, constipation.   Long standing history of blood on the  The stool , bright red, smears, painless, no change in shape of the stool, no weigh.t loss. , no prior colonoscopy, Father had colon polyps in his 28's , some perianal itching   Past Medical History:  Diagnosis Date  . Acid reflux   . Alopecia   . BRCA negative 05/2017   BRCA/MyRisk except POLE VUS  . Family history of breast cancer 05/2017   MyRisk neg; IBIS=14%  . Hemorrhoids   . Rectal bleeding 02/10/2013    Past Surgical History:  Procedure Laterality Date  . NO PAST SURGERIES    . TONSILLECTOMY Bilateral 10/17/2015   Procedure: TONSILLECTOMY;  Surgeon: Carloyn Manner, MD;  Location: [facility];  Service: ENT;  Laterality: Bilateral;    Prior to Admission medications   Medication Sig Start  Date End Date Taking? Authorizing Provider  clobetasol ointment (TEMOVATE) 2.97 % Apply 1 application topically as needed. 10/29/17   [provider]  famotidine (PEPCID) 20 MG tablet Take 1 tablet (20 mg total) by mouth 2 (two) times daily. As needed for heartburn. 07/06/19   Pleas Koch, NP  hydrocortisone (ANUSOL-HC) 25 MG suppository Place 1 suppository (25 mg total) rectally 2 (two) times daily. 09/16/17   Pleas Koch, NP  Levonorgestrel [facility]) 19.5 MG IUD by Intrauterine route.    [provider]  Multiple Vitamin (MULTIVITAMIN) tablet Take 1 tablet by mouth daily.    [provider]  polyethylene glycol powder (GLYCOLAX/MIRALAX) powder Take 17 g by mouth daily as needed.    [provider]  Probiotic Product (PROBIOTIC-10) CAPS Take 1 tablet by mouth daily.    [provider]  valACYclovir (VALTREX) 1000 MG tablet Take 2 tablets twice daily for one day at cold sore onset. 07/06/19   Pleas Koch, NP    Family History  Problem Relation Age of Onset  . Breast cancer Maternal Aunt 30  . Uterine cancer Maternal Aunt   . Hyperlipidemia Father   . Hypertension Father   . Lung cancer Father   . Hyperlipidemia Mother   . Hypertension Mother   . Diabetes Maternal Uncle   . Breast cancer Paternal Aunt 14     Social History   Tobacco Use  .  Smoking status: Never Smoker  . Smokeless tobacco: Never Used  Vaping Use  . Vaping Use: Never used  Substance Use Topics  . Alcohol use: Yes    Alcohol/week: 1.0 standard drink    Types: 1 Standard drinks or equivalent per week    Comment: occasional - 1 mixed drink/mo  . Drug use: No    Allergies as of 02/15/2020  . (No Known Allergies)    Review of Systems:    All systems reviewed and negative except where noted in HPI. General Appearance:    Alert, cooperative, no distress, appears stated age  Head:    Normocephalic, without obvious abnormality, atraumatic  Eyes:     PERRL, conjunctiva/corneas clear,  Ears:    Grossly normal hearing    Neurologic:   Grossly appears normal     Observations/Objective:  Labs: CBC    Component Value Date/Time   WBC 6.7 07/08/2019 0739   RBC 4.08 07/08/2019 0739   HGB 12.6 07/08/2019 0739   HGB 12.4 12/02/2017 1520   HCT 37.7 07/08/2019 0739   HCT 36.7 12/02/2017 1520   PLT 274.0 07/08/2019 0739   PLT 333 12/02/2017 1520   MCV 92.5 07/08/2019 0739   MCV 88 12/02/2017 1520   MCV 92 12/24/2012 0845   MCH 29.9 12/02/2017 1520   MCH 31.2 05/04/2015 1509   MCHC 33.4 07/08/2019 0739   RDW 13.0 07/08/2019 0739   RDW 14.1 12/02/2017 1520   RDW 12.9 12/24/2012 0845   LYMPHSABS 1.8 12/24/2012 0845   MONOABS 0.3 12/24/2012 0845   EOSABS 0.1 12/24/2012 0845   BASOSABS 0.0 12/24/2012 0845   CMP     Component Value Date/Time   NA 136 07/08/2019 0739   NA 136 12/24/2012 0845   K 3.8 07/08/2019 0739   K 3.8 12/24/2012 0845   CL 102 07/08/2019 0739   CL 104 12/24/2012 0845   CO2 28 07/08/2019 0739   CO2 26 12/24/2012 0845   GLUCOSE 86 07/08/2019 0739   GLUCOSE 85 12/24/2012 0845   BUN 14 07/08/2019 0739   BUN 10 12/24/2012 0845   CREATININE 0.82 07/08/2019 0739   CREATININE 0.74 07/01/2018 0842   CALCIUM 9.1 07/08/2019 0739   CALCIUM 8.7 12/24/2012 0845   PROT 7.2 07/08/2019 0739   PROT 7.5 12/24/2012 0845   ALBUMIN 4.4 07/08/2019 0739   ALBUMIN 3.9 12/24/2012 0845   AST 15 07/08/2019 0739   AST 22 12/24/2012 0845   ALT 13 07/08/2019 0739   ALT 26 12/24/2012 0845   ALKPHOS 71 07/08/2019 0739   ALKPHOS 82 12/24/2012 0845   BILITOT 0.6 07/08/2019 0739   BILITOT 0.8 12/24/2012 0845   GFRNONAA >89 05/12/2015 1043   GFRAA >89 05/12/2015 1043    Imaging Studies: No results found.  Assessment and Plan:   Kelly Sparks is a 44 y.o. y/o female has been referred for rectal bleeding, based on history sounds like internal hemorroids, family history of colon polyps.     Plan :   1. Counseled on  life style changes for constipation including high fiber diet , peri anal hygiene- will provide print outs. Continue miralax- if fails will consider banding of hemorrhoids.  2. Colonoscopy to evaluate rectal bleeding  3. F/u in 6 weeks     I discussed the assessment and treatment plan with the patient. The patient was provided an opportunity to ask questions and all were answered. The patient agreed with the plan and demonstrated an understanding of the  instructions.   The patient was advised to call back or seek an in-person evaluation if the symptoms worsen or if the condition fails to improve as anticipated.  I provided 14 minutes of face-to-face time during this encounter.   Dr Kelly Bellows MD,MRCP [facility]) Gastroenterology/Hepatology Pager: [phone]   Speech recognition software was used to dictate the above note.

## 2020-02-17 ENCOUNTER — Other Ambulatory Visit: Payer: Self-pay

## 2020-02-17 DIAGNOSIS — K625 Hemorrhage of anus and rectum: Secondary | ICD-10-CM

## 2020-02-17 MED ORDER — NA SULFATE-K SULFATE-MG SULF 17.5-3.13-1.6 GM/177ML PO SOLN: 1.0000 | Freq: Once | ORAL | 0 refills | Status: AC

## 2020-03-07 ENCOUNTER — Ambulatory Visit
Admission: RE | Admit: 2020-03-07 | Discharge: 2020-03-07 | Disposition: A | Discharge status: Home / Self Care | Payer: No Typology Code available for payment source | Source: Ambulatory Visit | Attending: Primary Care | Admitting: Primary Care

## 2020-03-07 DIAGNOSIS — R921 Mammographic calcification found on diagnostic imaging of breast: Secondary | ICD-10-CM | POA: Insufficient documentation

## 2020-03-14 ENCOUNTER — Other Ambulatory Visit
Admission: RE | Admit: 2020-03-14 | Discharge: 2020-03-14 | Disposition: A | Discharge status: Home / Self Care | Payer: No Typology Code available for payment source | Source: Ambulatory Visit | Attending: Gastroenterology | Admitting: Gastroenterology

## 2020-03-14 ENCOUNTER — Other Ambulatory Visit: Payer: Self-pay

## 2020-03-14 DIAGNOSIS — Z01812 Encounter for preprocedural laboratory examination: Secondary | ICD-10-CM | POA: Diagnosis not present

## 2020-03-14 DIAGNOSIS — Z20822 Contact with and (suspected) exposure to covid-19: Secondary | ICD-10-CM | POA: Diagnosis not present

## 2020-03-14 LAB — SARS CORONAVIRUS 2 (TAT 6-24 HRS): SARS Coronavirus 2: NEGATIVE

## 2020-03-16 ENCOUNTER — Encounter: Admission: RE | Payer: Self-pay | Source: Home / Self Care

## 2020-03-16 ENCOUNTER — Ambulatory Visit
Admission: RE | Admit: 2020-03-16 | Payer: No Typology Code available for payment source | Source: Home / Self Care | Admitting: Gastroenterology

## 2020-03-16 SURGERY — COLONOSCOPY WITH PROPOFOL
Anesthesia: General

## 2020-03-21 ENCOUNTER — Other Ambulatory Visit: Payer: Self-pay

## 2020-03-21 DIAGNOSIS — K625 Hemorrhage of anus and rectum: Secondary | ICD-10-CM

## 2020-03-21 MED ORDER — NA SULFATE-K SULFATE-MG SULF 17.5-3.13-1.6 GM/177ML PO SOLN: 1.0000 | Freq: Once | ORAL | 0 refills | Status: AC

## 2020-04-02 ENCOUNTER — Encounter: Payer: Self-pay | Admitting: Gastroenterology

## 2020-04-05 ENCOUNTER — Other Ambulatory Visit: Payer: Self-pay

## 2020-04-05 ENCOUNTER — Other Ambulatory Visit
Admission: RE | Admit: 2020-04-05 | Discharge: 2020-04-05 | Disposition: A | Discharge status: Home / Self Care | Payer: No Typology Code available for payment source | Source: Ambulatory Visit | Attending: Gastroenterology | Admitting: Gastroenterology

## 2020-04-05 DIAGNOSIS — Z01812 Encounter for preprocedural laboratory examination: Secondary | ICD-10-CM | POA: Insufficient documentation

## 2020-04-05 DIAGNOSIS — Z20822 Contact with and (suspected) exposure to covid-19: Secondary | ICD-10-CM | POA: Diagnosis not present

## 2020-04-06 LAB — SARS CORONAVIRUS 2 (TAT 6-24 HRS): SARS Coronavirus 2: NEGATIVE

## 2020-04-06 NOTE — Discharge Instructions (Signed)
General Anesthesia, Adult, Care After This sheet gives you information about how to care for yourself after your procedure. Your health care provider may also give you more specific instructions. If you have problems or questions, contact your health care provider. What can I expect after the procedure? After the procedure, the following side effects are common:  Pain or discomfort at the IV site.  Nausea.  Vomiting.  Sore throat.  Trouble concentrating.  Feeling cold or chills.  Weak or tired.  Sleepiness and fatigue.  Soreness and body aches. These side effects can affect parts of the body that were not involved in surgery. Follow these instructions at home:  For at least 24 hours after the procedure:  Have a responsible adult stay with you. It is important to have someone help care for you until you are awake and alert.  Rest as needed.  Do not: ? Participate in activities in which you could fall or become injured. ? Drive. ? Use heavy machinery. ? Drink alcohol. ? Take sleeping pills or medicines that cause drowsiness. ? Make important decisions or sign legal documents. ? Take care of children on your own. Eating and drinking  Follow any instructions from your health care provider about eating or drinking restrictions.  When you feel hungry, start by eating small amounts of foods that are soft and easy to digest (bland), such as toast. Gradually return to your regular diet.  Drink enough fluid to keep your urine pale yellow.  If you vomit, rehydrate by drinking water, juice, or clear broth. General instructions  If you have sleep apnea, surgery and certain medicines can increase your risk for breathing problems. Follow instructions from your health care provider about wearing your sleep device: ? Anytime you are sleeping, including during daytime naps. ? While taking prescription pain medicines, sleeping medicines, or medicines that make you drowsy.  Return to  your normal activities as told by your health care provider. Ask your health care provider what activities are safe for you.  Take over-the-counter and prescription medicines only as told by your health care provider.  If you smoke, do not smoke without supervision.  Keep all follow-up visits as told by your health care provider. This is important. Contact a health care provider if:  You have nausea or vomiting that does not get better with medicine.  You cannot eat or drink without vomiting.  You have pain that does not get better with medicine.  You are unable to pass urine.  You develop a skin rash.  You have a fever.  You have redness around your IV site that gets worse. Get help right away if:  You have difficulty breathing.  You have chest pain.  You have blood in your urine or stool, or you vomit blood. Summary  After the procedure, it is common to have a sore throat or nausea. It is also common to feel tired.  Have a responsible adult stay with you for the first 24 hours after general anesthesia. It is important to have someone help care for you until you are awake and alert.  When you feel hungry, start by eating small amounts of foods that are soft and easy to digest (bland), such as toast. Gradually return to your regular diet.  Drink enough fluid to keep your urine pale yellow.  Return to your normal activities as told by your health care provider. Ask your health care provider what activities are safe for you. This information is not   intended to replace advice given to you by your health care provider. Make sure you discuss any questions you have with your health care provider. Document Revised: 08/21/2017 Document Reviewed: 04/03/2017 Elsevier Patient Education  2020 Elsevier Inc.  

## 2020-04-09 ENCOUNTER — Encounter: Payer: Self-pay | Admitting: Gastroenterology

## 2020-04-09 ENCOUNTER — Other Ambulatory Visit: Payer: Self-pay

## 2020-04-09 ENCOUNTER — Ambulatory Visit
Admission: RE | Admit: 2020-04-09 | Discharge: 2020-04-09 | Disposition: A | Discharge status: Home / Self Care | Payer: No Typology Code available for payment source | Attending: Gastroenterology | Admitting: Gastroenterology

## 2020-04-09 ENCOUNTER — Encounter
Admission: RE | Disposition: A | Discharge status: Home / Self Care | Payer: Self-pay | Source: Home / Self Care | Attending: Gastroenterology

## 2020-04-09 ENCOUNTER — Ambulatory Visit: Payer: No Typology Code available for payment source | Admitting: Anesthesiology

## 2020-04-09 ENCOUNTER — Other Ambulatory Visit: Payer: Self-pay | Admitting: Gastroenterology

## 2020-04-09 DIAGNOSIS — K6289 Other specified diseases of anus and rectum: Secondary | ICD-10-CM | POA: Diagnosis not present

## 2020-04-09 DIAGNOSIS — Z793 Long term (current) use of hormonal contraceptives: Secondary | ICD-10-CM | POA: Diagnosis not present

## 2020-04-09 DIAGNOSIS — K219 Gastro-esophageal reflux disease without esophagitis: Secondary | ICD-10-CM | POA: Insufficient documentation

## 2020-04-09 DIAGNOSIS — K625 Hemorrhage of anus and rectum: Secondary | ICD-10-CM | POA: Insufficient documentation

## 2020-04-09 HISTORY — PX: BIOPSY: SHX5522

## 2020-04-09 HISTORY — PX: COLONOSCOPY WITH PROPOFOL: SHX5780

## 2020-04-09 LAB — POCT PREGNANCY, URINE: Preg Test, Ur: NEGATIVE

## 2020-04-09 SURGERY — COLONOSCOPY WITH PROPOFOL
Anesthesia: General | Site: Rectum

## 2020-04-09 MED ORDER — LACTATED RINGERS IV SOLN: INTRAVENOUS | Status: DC

## 2020-04-09 MED ORDER — ONDANSETRON HCL 4 MG/2ML IJ SOLN: 4.0000 mg | Freq: Once | INTRAMUSCULAR | Status: DC | PRN

## 2020-04-09 MED ORDER — STERILE WATER FOR IRRIGATION IR SOLN
Status: DC | PRN
  Administered 2020-04-09: 100 mL

## 2020-04-09 MED ORDER — SODIUM CHLORIDE 0.9 % IV SOLN: INTRAVENOUS | Status: DC

## 2020-04-09 MED ORDER — LIDOCAINE HCL (CARDIAC) PF 100 MG/5ML IV SOSY
PREFILLED_SYRINGE | INTRAVENOUS | Status: DC | PRN
  Administered 2020-04-09: 30 mg via INTRAVENOUS

## 2020-04-09 MED ORDER — ACETAMINOPHEN 10 MG/ML IV SOLN: 1000.0000 mg | Freq: Once | INTRAVENOUS | Status: DC | PRN

## 2020-04-09 MED ORDER — PROPOFOL 10 MG/ML IV BOLUS
INTRAVENOUS | Status: DC | PRN
  Administered 2020-04-09: 150 mg via INTRAVENOUS
  Administered 2020-04-09: 50 mg via INTRAVENOUS
  Administered 2020-04-09: 30 mg via INTRAVENOUS
  Administered 2020-04-09: 20 mg via INTRAVENOUS
  Administered 2020-04-09: 30 mg via INTRAVENOUS
  Administered 2020-04-09: 20 mg via INTRAVENOUS
  Administered 2020-04-09 (×4): 30 mg via INTRAVENOUS

## 2020-04-09 SURGICAL SUPPLY — 24 items
CLIP HMST 235XBRD CATH ROT (MISCELLANEOUS) IMPLANT
CLIP RESOLUTION 360 11X235 (MISCELLANEOUS)
ELECT REM PT RETURN 9FT ADLT (ELECTROSURGICAL)
ELECTRODE REM PT RTRN 9FT ADLT (ELECTROSURGICAL) IMPLANT
FCP ESCP3.2XJMB 240X2.8X (MISCELLANEOUS)
FORCEPS BIOP RAD 4 LRG CAP 4 (CUTTING FORCEPS) ×4 IMPLANT
FORCEPS BIOP RJ4 240 W/NDL (MISCELLANEOUS)
FORCEPS ESCP3.2XJMB 240X2.8X (MISCELLANEOUS) IMPLANT
GOWN CVR UNV OPN BCK APRN NK (MISCELLANEOUS) ×4 IMPLANT
GOWN ISOL THUMB LOOP REG UNIV (MISCELLANEOUS) ×8
INJECTOR VARIJECT VIN23 (MISCELLANEOUS) IMPLANT
KIT DEFENDO VALVE AND CONN (KITS) IMPLANT
KIT ENDO PROCEDURE OLY (KITS) ×4 IMPLANT
MANIFOLD NEPTUNE II (INSTRUMENTS) ×4 IMPLANT
MARKER SPOT ENDO TATTOO 5ML (MISCELLANEOUS) IMPLANT
PROBE APC STR FIRE (PROBE) IMPLANT
RETRIEVER NET ROTH 2.5X230 LF (MISCELLANEOUS) IMPLANT
SNARE SHORT THROW 13M SML OVAL (MISCELLANEOUS) IMPLANT
SNARE SHORT THROW 30M LRG OVAL (MISCELLANEOUS) IMPLANT
SNARE SNG USE RND 15MM (INSTRUMENTS) IMPLANT
SPOT EX ENDOSCOPIC TATTOO (MISCELLANEOUS)
TRAP ETRAP POLY (MISCELLANEOUS) IMPLANT
VARIJECT INJECTOR VIN23 (MISCELLANEOUS)
WATER STERILE IRR 250ML POUR (IV SOLUTION) ×4 IMPLANT

## 2020-04-09 NOTE — Transfer of Care (Signed)
Immediate Anesthesia Transfer of Care Note  Patient: Kelly Sparks  Procedure(s) Performed: COLONOSCOPY WITH PROPOFOL (N/A ) BIOPSY (N/A Rectum)  Patient Location: PACU  Anesthesia Type: General  Level of Consciousness: awake, alert  and patient cooperative  Airway and Oxygen Therapy: Patient Spontanous Breathing and Patient connected to supplemental oxygen  Post-op Assessment: Post-op Vital signs reviewed, Patient's Cardiovascular Status Stable, Respiratory Function Stable, Patent Airway and No signs of Nausea or vomiting  Post-op Vital Signs: Reviewed and stable  Complications: No complications documented.

## 2020-04-09 NOTE — Anesthesia Procedure Notes (Signed)
Date/Time: 04/09/2020 12:20 PM Performed by: Maree Krabbe, CRNA Pre-anesthesia Checklist: Patient identified, Emergency Drugs available, Suction available, Timeout performed and Patient being monitored Patient Re-evaluated:Patient Re-evaluated prior to induction Oxygen Delivery Method: Nasal cannula Placement Confirmation: positive ETCO2

## 2020-04-09 NOTE — Anesthesia Preprocedure Evaluation (Signed)
Anesthesia Evaluation  Patient identified by MRN, date of birth, ID band Patient awake    Reviewed: Allergy & Precautions, NPO status , Patient's Chart, lab work & pertinent test results, reviewed documented beta blocker date and time   History of Anesthesia Complications Negative for: history of anesthetic complications  Airway Mallampati: III  TM Distance: >3 FB Neck ROM: Full    Dental   Pulmonary    breath sounds clear to auscultation       Cardiovascular (-) angina(-) DOE  Rhythm:Regular Rate:Normal     Neuro/Psych PSYCHIATRIC DISORDERS Anxiety    GI/Hepatic GERD  ,  Endo/Other  diabetes (Pre-DM)  Renal/GU      Musculoskeletal   Abdominal   Peds  Hematology   Anesthesia Other Findings   Reproductive/Obstetrics                            Anesthesia Physical Anesthesia Plan  ASA: I  Anesthesia Plan: General   Post-op Pain Management:    Induction: Intravenous  PONV Risk Score and Plan: 3 and Propofol infusion, TIVA and Treatment may vary due to age or medical condition  Airway Management Planned: Natural Airway and Nasal Cannula  Additional Equipment:   Intra-op Plan:   Post-operative Plan:   Informed Consent: I have reviewed the patients History and Physical, chart, labs and discussed the procedure including the risks, benefits and alternatives for the proposed anesthesia with the patient or authorized representative who has indicated his/her understanding and acceptance.       Plan Discussed with: CRNA and Anesthesiologist  Anesthesia Plan Comments:         Anesthesia Quick Evaluation

## 2020-04-09 NOTE — H&P (Signed)
Vonda Antigua, MD [address] [place], [place], [place], [address] [address], [place], [place], [place], [address] Phone: [phone]  Fax: [phone]  Primary Care Physician:  Pleas Koch, NP   Pre-Procedure History & Physical: HPI:  Kelly Sparks is a 44 y.o. female is here for a colonoscopy.   Past Medical History:  Diagnosis Date  . Acid reflux   . Alopecia   . BRCA negative 05/2017   BRCA/MyRisk except POLE VUS  . Family history of breast cancer 05/2017   MyRisk neg; IBIS=14%  . Hemorrhoids   . Rectal bleeding 02/10/2013    Past Surgical History:  Procedure Laterality Date  . NO PAST SURGERIES    . TONSILLECTOMY Bilateral 10/17/2015   Procedure: TONSILLECTOMY;  Surgeon: Carloyn Manner, MD;  Location: [facility];  Service: ENT;  Laterality: Bilateral;    Prior to Admission medications   Medication Sig Start Date End Date Taking? Authorizing Provider  clobetasol ointment (TEMOVATE) 5.78 % Apply 1 application topically as needed. 10/29/17  Yes [provider]  famotidine (PEPCID) 20 MG tablet Take 1 tablet (20 mg total) by mouth 2 (two) times daily. As needed for heartburn. 07/06/19  Yes Pleas Koch, NP  hydrocortisone (ANUSOL-HC) 25 MG suppository Place 1 suppository (25 mg total) rectally 2 (two) times daily. 09/16/17  Yes Pleas Koch, NP  Levonorgestrel [facility]) 19.5 MG IUD by Intrauterine route.   Yes [provider]  Multiple Vitamin (MULTIVITAMIN) tablet Take 1 tablet by mouth daily.   Yes [provider]  polyethylene glycol powder (GLYCOLAX/MIRALAX) powder Take 17 g by mouth daily as needed.   Yes [provider]  Probiotic Product (PROBIOTIC-10) CAPS Take 1 tablet by mouth daily.   Yes [provider]  valACYclovir (VALTREX) 1000 MG tablet Take 2 tablets twice daily for one day at cold sore onset. 07/06/19  Yes Pleas Koch, NP    Allergies as of 03/21/2020  . (No  Known Allergies)    Family History  Problem Relation Age of Onset  . Breast cancer Maternal Aunt 30  . Uterine cancer Maternal Aunt   . Hyperlipidemia Father   . Hypertension Father   . Lung cancer Father   . Hyperlipidemia Mother   . Hypertension Mother   . Diabetes Maternal Uncle   . Breast cancer Paternal Aunt 38    Social History   Socioeconomic History  . Marital status: Married    Spouse name: Not on file  . Number of children: Not on file  . Years of education: Not on file  . Highest education level: Not on file  Occupational History  . Not on file  Tobacco Use  . Smoking status: Never Smoker  . Smokeless tobacco: Never Used  Vaping Use  . Vaping Use: Never used  Substance and Sexual Activity  . Alcohol use: Yes    Alcohol/week: 1.0 standard drink    Types: 1 Standard drinks or equivalent per week    Comment: occasional - 1 mixed drink/mo  . Drug use: No  . Sexual activity: Yes    Birth control/protection: Surgical    Comment: vasectomy  Other Topics Concern  . Not on file  Social History Narrative  . Not on file   Social Determinants of Health   Financial Resource Strain:   . Difficulty of Paying Living Expenses:   Food Insecurity:   . Worried About Charity fundraiser in the Last Year:   .  Ran Out of Food in the Last Year:   Transportation Needs:   . Film/video editor (Medical):   Marland Kitchen Lack of Transportation (Non-Medical):   Physical Activity:   . Days of Exercise per Week:   . Minutes of Exercise per Session:   Stress:   . Feeling of Stress :   Social Connections:   . Frequency of Communication with Friends and Family:   . Frequency of Social Gatherings with Friends and Family:   . Attends Religious Services:   . Active Member of Clubs or Organizations:   . Attends Archivist Meetings:   Marland Kitchen Marital Status:   Intimate Partner Violence:   . Fear of Current or Ex-Partner:   . Emotionally Abused:   Marland Kitchen Physically Abused:   .  Sexually Abused:     Review of Systems: See HPI, otherwise negative ROS  Physical Exam: BP 122/89   Pulse 69   Temp 98.1 F (36.7 C) (Temporal)   Resp 16   Ht 5' 4"  (1.626 m)   Wt 79.8 kg   SpO2 100%   BMI 30.21 kg/m  General:   Alert,  pleasant and cooperative in NAD Head:  Normocephalic and atraumatic. Neck:  Supple; no masses or thyromegaly. Lungs:  Clear throughout to auscultation, normal respiratory effort.    Heart:  +S1, +S2, Regular rate and rhythm, No edema. Abdomen:  Soft, nontender and nondistended. Normal bowel sounds, without guarding, and without rebound.   Neurologic:  Alert and  oriented x4;  grossly normal neurologically.  Impression/Plan: Kelly Sparks is here for a colonoscopy to be performed for rectal bleeding  Risks, benefits, limitations, and alternatives regarding  colonoscopy have been reviewed with the patient.  Questions have been answered.  All parties agreeable.   Virgel Manifold, MD  04/09/2020, 12:00 PM

## 2020-04-09 NOTE — Anesthesia Postprocedure Evaluation (Signed)
Anesthesia Post Note  Patient: Kelly Sparks  Procedure(s) Performed: COLONOSCOPY WITH PROPOFOL (N/A ) BIOPSY (N/A Rectum)     Patient location during evaluation: PACU Anesthesia Type: General Level of consciousness: awake and alert Pain management: pain level controlled Vital Signs Assessment: post-procedure vital signs reviewed and stable Respiratory status: spontaneous breathing, nonlabored ventilation, respiratory function stable and patient connected to nasal cannula oxygen Cardiovascular status: blood pressure returned to baseline and stable Postop Assessment: no apparent nausea or vomiting Anesthetic complications: no   No complications documented.  Farooq Petrovich A  Aishah Teffeteller

## 2020-04-09 NOTE — Op Note (Signed)
[facility] Gastroenterology Patient Name: Kelly Sparks Procedure Date: 04/09/2020 12:10 PM MRN: [id number] Account #: [id number] Date of Birth: 1975-09-10 Admit Type: Outpatient Age: 44 Room: [facility] OR ROOM 01 Gender: Female Note Status: Finalized Procedure:             Colonoscopy Indications:           Rectal bleeding Providers:             Everest Hacking B. Maximino Greenland MD, MD Referring MD:          Doreene Nest (Referring MD) Medicines:             Monitored Anesthesia Care Complications:         No immediate complications. Procedure:             Pre-Anesthesia Assessment:                        - Prior to the procedure, a History and Physical was                         performed, and patient medications, allergies and                         sensitivities were reviewed. The patient's tolerance                         of previous anesthesia was reviewed.                        - The risks and benefits of the procedure and the                         sedation options and risks were discussed with the                         patient. All questions were answered and informed                         consent was obtained.                        - Patient identification and proposed procedure were                         verified prior to the procedure by the physician, the                         nurse, the anesthetist and the technician. The                         procedure was verified in the pre-procedure area in                         the procedure room in the endoscopy suite.                        - ASA Grade Assessment: II - A patient with mild  systemic disease.                        - After reviewing the risks and benefits, the patient                         was deemed in satisfactory condition to undergo the                         procedure.                        After obtaining informed consent, the colonoscope was                          passed under direct vision. Throughout the procedure,                         the patient's blood pressure, pulse, and oxygen                         saturations were monitored continuously. The was                         introduced through the anus and advanced to the the                         cecum, identified by appendiceal orifice and ileocecal                         valve. The colonoscopy was performed with ease. The                         patient tolerated the procedure well. The quality of                         the bowel preparation was good. Findings:      The perianal and digital rectal examinations were normal.      A patchy area of mildly erythematous mucosa was found in the rectum.       Biopsies were taken with a cold forceps for histology.      The sigmoid colon, descending colon, transverse colon, ascending colon       and cecum appeared normal.      The retroflexed view of the distal rectum and anal verge was normal and       showed no anal or rectal abnormalities. Impression:            - Erythematous mucosa in the rectum. Biopsied.                        - The sigmoid colon, descending colon, transverse                         colon, ascending colon and cecum are normal.                        - The distal rectum and anal verge are normal on  retroflexion view. Recommendation:        - Await pathology results.                        - Discharge patient to home.                        - Resume previous diet.                        - Continue present medications.                        - Return to primary care physician as previously                         scheduled.                        - The findings and recommendations were discussed with                         the patient.                        - The findings and recommendations were discussed with                         the patient's family.                        -  Repeat colonoscopy in 5 years for screening purposes.                        - Return to GI clinic as previously scheduled. Procedure Code(s):     --- Professional ---                        [phone], Colonoscopy, flexible; with biopsy, single or                         multiple Diagnosis Code(s):     --- Professional ---                        K62.89, Other specified diseases of anus and rectum                        K62.5, Hemorrhage of anus and rectum CPT copyright 2019 American Medical Association. All rights reserved. The codes documented in this report are preliminary and upon coder review may  be revised to meet current compliance requirements.  Melodie Bouillon, MD Michel Bickers B. Maximino Greenland MD, MD 04/09/2020 12:52:47 PM This report has been signed electronically. Number of Addenda: 0 Note Initiated On: 04/09/2020 12:10 PM Scope Withdrawal Time: 0 hours 17 minutes 34 seconds  Total Procedure Duration: 0 hours 22 minutes 9 seconds  Estimated Blood Loss:  Estimated blood loss: none.      [facility]

## 2020-04-10 ENCOUNTER — Encounter: Payer: Self-pay | Admitting: Gastroenterology

## 2020-04-11 LAB — SURGICAL PATHOLOGY

## 2020-04-12 LAB — PATHOLOGY

## 2020-05-30 ENCOUNTER — Ambulatory Visit (INDEPENDENT_AMBULATORY_CARE_PROVIDER_SITE_OTHER): Payer: No Typology Code available for payment source | Admitting: Primary Care

## 2020-05-30 ENCOUNTER — Encounter: Payer: Self-pay | Admitting: Primary Care

## 2020-05-30 ENCOUNTER — Other Ambulatory Visit: Payer: Self-pay

## 2020-05-30 VITALS — BP 132/70 | HR 71 | Temp 96.7°F | Ht 64.25 in | Wt 181.5 lb

## 2020-05-30 DIAGNOSIS — F411 Generalized anxiety disorder: Secondary | ICD-10-CM

## 2020-05-30 DIAGNOSIS — Z23 Encounter for immunization: Secondary | ICD-10-CM | POA: Diagnosis not present

## 2020-05-30 DIAGNOSIS — L659 Nonscarring hair loss, unspecified: Secondary | ICD-10-CM

## 2020-05-30 DIAGNOSIS — K649 Unspecified hemorrhoids: Secondary | ICD-10-CM | POA: Diagnosis not present

## 2020-05-30 DIAGNOSIS — K219 Gastro-esophageal reflux disease without esophagitis: Secondary | ICD-10-CM

## 2020-05-30 DIAGNOSIS — Z0000 Encounter for general adult medical examination without abnormal findings: Secondary | ICD-10-CM

## 2020-05-30 DIAGNOSIS — B001 Herpesviral vesicular dermatitis: Secondary | ICD-10-CM

## 2020-05-30 DIAGNOSIS — Z1159 Encounter for screening for other viral diseases: Secondary | ICD-10-CM

## 2020-05-30 DIAGNOSIS — R7303 Prediabetes: Secondary | ICD-10-CM

## 2020-05-30 DIAGNOSIS — Z803 Family history of malignant neoplasm of breast: Secondary | ICD-10-CM

## 2020-05-30 LAB — POC URINALSYSI DIPSTICK (AUTOMATED)
Bilirubin, UA: NEGATIVE
Blood, UA: NEGATIVE
Glucose, UA: NEGATIVE
Ketones, UA: 5
Leukocytes, UA: NEGATIVE
Nitrite, UA: NEGATIVE
Protein, UA: NEGATIVE
Spec Grav, UA: 1.02 (ref 1.010–1.025)
Urobilinogen, UA: 0.2 E.U./dL
pH, UA: 6 (ref 5.0–8.0)

## 2020-05-30 MED ORDER — HYDROCORTISONE ACETATE 25 MG RE SUPP: 25.0000 mg | Freq: Two times a day (BID) | RECTAL | 0 refills | Status: DC

## 2020-05-30 NOTE — Assessment & Plan Note (Signed)
Repeat A1C pending. Encouraged a healthy diet and regular exercise. 

## 2020-05-30 NOTE — Assessment & Plan Note (Signed)
Chronic and ongoing, previously following with dermatology, infrequent use of clobetasol ointment.

## 2020-05-30 NOTE — Progress Notes (Signed)
Subjective:    Patient ID: Kelly Sparks, female    DOB: May 27, 1976, 44 y.o.   MRN: [id number]  HPI  This visit occurred during the SARS-CoV-2 public health emergency.  Safety protocols were in place, including screening questions prior to the visit, additional usage of staff PPE, and extensive cleaning of exam room while observing appropriate contact time as indicated for disinfecting solutions.   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   Immunizations: -Tetanus: Completed  -Influenza: Due today -Covid-19: Completed series  Diet: She endorses a fair diet.  Exercise: She is exercising some  Eye exam: Completed years ago Dental exam: Completes semi-annually   Pap Smear: Completed in 2018, follows with GYN.  Mammogram: Completed in 2021 Hep C Screen: Due   BP Readings from Last 3 Encounters:  05/30/20 132/70  04/09/20 105/86  12/28/19 104/78   Wt Readings from Last 3 Encounters:  05/30/20 181 lb 8 oz (82.3 kg)  04/09/20 176 lb (79.8 kg)  12/28/19 189 lb 8 oz (86 kg)     Review of Systems  Constitutional: Negative for unexpected weight change.  HENT: Negative for rhinorrhea.   Respiratory: Negative for cough and shortness of breath.   Cardiovascular: Negative for chest pain.  Gastrointestinal: Negative for constipation and diarrhea.  Genitourinary: Negative for difficulty urinating and menstrual problem.  Musculoskeletal: Negative for arthralgias and myalgias.  Skin: Negative for rash.  Allergic/Immunologic: Negative for environmental allergies.  Neurological: Negative for dizziness, numbness and headaches.  Psychiatric/Behavioral: The patient is not nervous/anxious.        Past Medical History:  Diagnosis Date  . Acid reflux   . Alopecia   . BRCA negative 05/2017   BRCA/MyRisk except POLE VUS  . Family history of breast cancer 05/2017   MyRisk neg; IBIS=14%  . Hemorrhoids   .  Rectal bleeding 02/10/2013     Social History   Socioeconomic History  . Marital status: Married    Spouse name: Not on file  . Number of children: Not on file  . Years of education: Not on file  . Highest education level: Not on file  Occupational History  . Not on file  Tobacco Use  . Smoking status: Never Smoker  . Smokeless tobacco: Never Used  Vaping Use  . Vaping Use: Never used  Substance and Sexual Activity  . Alcohol use: Yes    Alcohol/week: 1.0 standard drink    Types: 1 Standard drinks or equivalent per week    Comment: occasional - 1 mixed drink/mo  . Drug use: No  . Sexual activity: Yes    Birth control/protection: Surgical    Comment: vasectomy  Other Topics Concern  . Not on file  Social History Narrative  . Not on file   Social Determinants of Health   Financial Resource Strain:   . Difficulty of Paying Living Expenses: Not on file  Food Insecurity:   . Worried About Charity fundraiser in the Last Year: Not on file  . Ran Out of Food in the Last Year: Not on file  Transportation Needs:   . Lack of Transportation (Medical): Not on file  . Lack of Transportation (Non-Medical): Not on file  Physical Activity:   . Days of Exercise per Week: Not on file  . Minutes of Exercise per Session: Not on file  Stress:   . Feeling of Stress : Not on file  Social Connections:   . Frequency of Communication with Friends and Family: Not on file  . Frequency of Social Gatherings with Friends and Family: Not on file  . Attends Religious Services: Not on file  . Active Member of Clubs or Organizations: Not on file  . Attends Archivist Meetings: Not on file  . Marital Status: Not on file  Intimate Partner Violence:   . Fear of Current or Ex-Partner: Not on file  . Emotionally Abused: Not on file  . Physically Abused: Not on file  . Sexually Abused: Not on file    Past Surgical History:  Procedure Laterality Date  . BIOPSY N/A 04/09/2020   Procedure:  BIOPSY;  Surgeon: Virgel Manifold, MD;  Location: [facility];  Service: Endoscopy;  Laterality: N/A;  . COLONOSCOPY WITH PROPOFOL N/A 04/09/2020   Procedure: COLONOSCOPY WITH PROPOFOL;  Surgeon: Virgel Manifold, MD;  Location: [facility];  Service: Endoscopy;  Laterality: N/A;  . NO PAST SURGERIES    . TONSILLECTOMY Bilateral 10/17/2015   Procedure: TONSILLECTOMY;  Surgeon: Carloyn Manner, MD;  Location: [facility];  Service: ENT;  Laterality: Bilateral;    Family History  Problem Relation Age of Onset  . Breast cancer Maternal Aunt 30  . Uterine cancer Maternal Aunt   . Hyperlipidemia Father   . Hypertension Father   . Lung cancer Father   . Hyperlipidemia Mother   . Hypertension Mother   . Diabetes Maternal Uncle   . Breast cancer Paternal Aunt 29    No Known Allergies  Current Outpatient Medications on File Prior to Visit  Medication Sig Dispense Refill  . clobetasol ointment (TEMOVATE) 2.69 % Apply 1 application topically as needed.    . famotidine (PEPCID) 20 MG tablet Take 1 tablet (20 mg total) by mouth 2 (two) times daily. As needed for heartburn. 180 tablet 0  . hydrocortisone (ANUSOL-HC) 25 MG suppository Place 1 suppository (25 mg total) rectally 2 (two) times daily. 12 suppository 0  . Levonorgestrel (KYLEENA) 19.5 MG IUD by Intrauterine route.    . Multiple Vitamin (MULTIVITAMIN) tablet Take 1 tablet by mouth daily.    . polyethylene glycol powder (GLYCOLAX/MIRALAX) powder Take 17 g by mouth daily as needed.    . Probiotic Product (PROBIOTIC-10) CAPS Take 1 tablet by mouth daily.    . valACYclovir (VALTREX) 1000 MG tablet Take 2 tablets twice daily for one day at cold sore onset. 12 tablet 0   No current facility-administered medications on file prior to visit.    BP 132/70 (BP Location: Left Arm, Patient Position: Sitting, Cuff Size: Normal)   Pulse 71   Temp (!) 96.7 F (35.9 C) (Temporal)   Ht 5' 4.25" (1.632 m)   Wt 181 lb  8 oz (82.3 kg)   SpO2 99%   BMI 30.91 kg/m    Objective:   Physical Exam HENT:     Right Ear: Tympanic membrane and ear canal normal.     Left Ear: Tympanic membrane and ear canal normal.  Eyes:     Pupils: Pupils are equal, round, and reactive to light.  Cardiovascular:     Rate and Rhythm: Normal rate and regular rhythm.  Pulmonary:     Effort: Pulmonary effort is normal.     Breath sounds: Normal breath sounds.  Abdominal:     General: Bowel sounds are normal.     Palpations: Abdomen is soft.     Tenderness: There is no abdominal tenderness.  Musculoskeletal:        General: Normal range of motion.     Cervical back: Neck supple.  Skin:    General: Skin is warm and dry.  Neurological:     Mental Status: She is alert and oriented to person, place, and time.     Cranial Nerves: No cranial nerve deficit.     Deep Tendon Reflexes:     Reflex Scores:      Patellar reflexes are 2+ on the right side and 2+ on the left side. Psychiatric:        Mood and Affect: Mood normal.            Assessment & Plan:

## 2020-05-30 NOTE — Assessment & Plan Note (Signed)
Infrequent cold sores, using valacyclovir sparingly. Continue same.

## 2020-05-30 NOTE — Assessment & Plan Note (Signed)
Intermittent with certain foods, using famotidine as needed.

## 2020-05-30 NOTE — Assessment & Plan Note (Signed)
Overall stable, manages well on her own. Continue to monitor.

## 2020-05-30 NOTE — Patient Instructions (Signed)
Start exercising. You should be getting 150 minutes of moderate intensity exercise weekly.  It's important to improve your diet by reducing consumption of fast food, fried food, processed snack foods, sugary drinks. Increase consumption of fresh vegetables and fruits, whole grains, water.  Ensure you are drinking 64 ounces of water daily.  Schedule a lab appointment to return fasting for your labs.  We will call you later once your paperwork is ready.  It was a pleasure to see you today!   Preventive Care 44-44 Years Old, Female Preventive care refers to visits with your health care provider and lifestyle choices that can promote health and wellness. This includes:  A yearly physical exam. This may also be called an annual well check.  Regular dental visits and eye exams.  Immunizations.  Screening for certain conditions.  Healthy lifestyle choices, such as eating a healthy diet, getting regular exercise, not using drugs or products that contain nicotine and tobacco, and limiting alcohol use. What can I expect for my preventive care visit? Physical exam Your health care provider will check your:  Height and weight. This may be used to calculate body mass index (BMI), which tells if you are at a healthy weight.  Heart rate and blood pressure.  Skin for abnormal spots. Counseling Your health care provider may ask you questions about your:  Alcohol, tobacco, and drug use.  Emotional well-being.  Home and relationship well-being.  Sexual activity.  Eating habits.  Work and work Statistician.  Method of birth control.  Menstrual cycle.  Pregnancy history. What immunizations do I need?  Influenza (flu) vaccine  This is recommended every year. Tetanus, diphtheria, and pertussis (Tdap) vaccine  You may need a Td booster every 10 years. Varicella (chickenpox) vaccine  You may need this if you have not been vaccinated. Zoster (shingles) vaccine  You may need  this after age 26. Measles, mumps, and rubella (MMR) vaccine  You may need at least one dose of MMR if you were born in 1957 or later. You may also need a second dose. Pneumococcal conjugate (PCV13) vaccine  You may need this if you have certain conditions and were not previously vaccinated. Pneumococcal polysaccharide (PPSV23) vaccine  You may need one or two doses if you smoke cigarettes or if you have certain conditions. Meningococcal conjugate (MenACWY) vaccine  You may need this if you have certain conditions. Hepatitis A vaccine  You may need this if you have certain conditions or if you travel or work in places where you may be exposed to hepatitis A. Hepatitis B vaccine  You may need this if you have certain conditions or if you travel or work in places where you may be exposed to hepatitis B. Haemophilus influenzae type b (Hib) vaccine  You may need this if you have certain conditions. Human papillomavirus (HPV) vaccine  If recommended by your health care provider, you may need three doses over 6 months. You may receive vaccines as individual doses or as more than one vaccine together in one shot (combination vaccines). Talk with your health care provider about the risks and benefits of combination vaccines. What tests do I need? Blood tests  Lipid and cholesterol levels. These may be checked every 5 years, or more frequently if you are over 62 years old.  Hepatitis C test.  Hepatitis B test. Screening  Lung cancer screening. You may have this screening every year starting at age 52 if you have a 30-pack-year history of smoking and currently  smoke or have quit within the past 15 years.  Colorectal cancer screening. All adults should have this screening starting at age 70 and continuing until age 81. Your health care provider may recommend screening at age 72 if you are at increased risk. You will have tests every 1-10 years, depending on your results and the type of  screening test.  Diabetes screening. This is done by checking your blood sugar (glucose) after you have not eaten for a while (fasting). You may have this done every 1-3 years.  Mammogram. This may be done every 1-2 years. Talk with your health care provider about when you should start having regular mammograms. This may depend on whether you have a family history of breast cancer.  BRCA-related cancer screening. This may be done if you have a family history of breast, ovarian, tubal, or peritoneal cancers.  Pelvic exam and Pap test. This may be done every 3 years starting at age 36. Starting at age 44, this may be done every 5 years if you have a Pap test in combination with an HPV test. Other tests  Sexually transmitted disease (STD) testing.  Bone density scan. This is done to screen for osteoporosis. You may have this scan if you are at high risk for osteoporosis. Follow these instructions at home: Eating and drinking  Eat a diet that includes fresh fruits and vegetables, whole grains, lean protein, and low-fat dairy.  Take vitamin and mineral supplements as recommended by your health care provider.  Do not drink alcohol if: ? Your health care provider tells you not to drink. ? You are pregnant, may be pregnant, or are planning to become pregnant.  If you drink alcohol: ? Limit how much you have to 0-1 drink a day. ? Be aware of how much alcohol is in your drink. In the [place] one drink equals one 12 oz bottle of beer (355 mL), one 5 oz glass of wine (148 mL), or one 1 oz glass of hard liquor (44 mL). Lifestyle  Take daily care of your teeth and gums.  Stay active. Exercise for at least 30 minutes on 5 or more days each week.  Do not use any products that contain nicotine or tobacco, such as cigarettes, e-cigarettes, and chewing tobacco. If you need help quitting, ask your health care provider.  If you are sexually active, practice safe sex. Use a condom or other form of birth  control (contraception) in order to prevent pregnancy and STIs (sexually transmitted infections).  If told by your health care provider, take low-dose aspirin daily starting at age 27. What's next?  Visit your health care provider once a year for a well check visit.  Ask your health care provider how often you should have your eyes and teeth checked.  Stay up to date on all vaccines. This information is not intended to replace advice given to you by your health care provider. Make sure you discuss any questions you have with your health care provider. Document Revised: 04/29/2018 Document Reviewed: 04/29/2018 Elsevier Patient Education  2020 [employer].

## 2020-05-30 NOTE — Assessment & Plan Note (Signed)
Intermittent, using hydrocortisone suppositories PRN. Colonoscopy completed last year.

## 2020-05-30 NOTE — Assessment & Plan Note (Signed)
Mammogram UTD.  Continue annual screenings.  

## 2020-05-30 NOTE — Assessment & Plan Note (Signed)
Flu vaccine provided today. Other immunizations appear UTD.  Mammogram UTD. Colonoscopy UTD, due in 2026. Pap smear due, offered to complete today, but she will return to her GYN for IUD check up.  Discussed the importance of a healthy diet and regular exercise in order for weight loss, and to reduce the risk of any potential medical problems.  Exam today unremarkable. Labs pending.

## 2020-05-31 ENCOUNTER — Other Ambulatory Visit (INDEPENDENT_AMBULATORY_CARE_PROVIDER_SITE_OTHER): Payer: No Typology Code available for payment source

## 2020-05-31 ENCOUNTER — Other Ambulatory Visit: Payer: Self-pay

## 2020-05-31 DIAGNOSIS — R7303 Prediabetes: Secondary | ICD-10-CM | POA: Diagnosis not present

## 2020-05-31 DIAGNOSIS — Z1159 Encounter for screening for other viral diseases: Secondary | ICD-10-CM

## 2020-05-31 DIAGNOSIS — Z111 Encounter for screening for respiratory tuberculosis: Secondary | ICD-10-CM

## 2020-05-31 LAB — COMPREHENSIVE METABOLIC PANEL
ALT: 15 U/L (ref 0–35)
AST: 17 U/L (ref 0–37)
Albumin: 4.6 g/dL (ref 3.5–5.2)
Alkaline Phosphatase: 67 U/L (ref 39–117)
BUN: 11 mg/dL (ref 6–23)
CO2: 25 mEq/L (ref 19–32)
Calcium: 9.4 mg/dL (ref 8.4–10.5)
Chloride: 101 mEq/L (ref 96–112)
Creatinine, Ser: 0.83 mg/dL (ref 0.40–1.20)
GFR: 90.25 mL/min (ref >60.00)
Glucose, Bld: 81 mg/dL (ref 70–99)
Potassium: 3.8 mEq/L (ref 3.5–5.1)
Sodium: 134 mEq/L — ABNORMAL LOW (ref 135–145)
Total Bilirubin: 0.8 mg/dL (ref 0.2–1.2)
Total Protein: 7.5 g/dL (ref 6.0–8.3)

## 2020-05-31 LAB — CBC
HCT: 38.9 % (ref 36.0–46.0)
Hemoglobin: 12.8 g/dL (ref 12.0–15.0)
MCHC: 33 g/dL (ref 30.0–36.0)
MCV: 92.2 fl (ref 78.0–100.0)
Platelets: 272 10*3/uL (ref 150.0–400.0)
RBC: 4.22 Mil/uL (ref 3.87–5.11)
RDW: 13.7 % (ref 11.5–15.5)
WBC: 6.4 10*3/uL (ref 4.0–10.5)

## 2020-05-31 LAB — LIPID PANEL
Cholesterol: 183 mg/dL (ref 0–200)
HDL: 62 mg/dL (ref >39.00)
LDL Cholesterol: 114 mg/dL — ABNORMAL HIGH (ref 0–99)
NonHDL: 121.37
Total CHOL/HDL Ratio: 3
Triglycerides: 38 mg/dL (ref 0.0–149.0)
VLDL: 7.6 mg/dL (ref 0.0–40.0)

## 2020-05-31 LAB — HEMOGLOBIN A1C: Hgb A1c MFr Bld: 6.1 % (ref 4.6–6.5)

## 2020-05-31 NOTE — Addendum Note (Signed)
Addended by: Alvina Chou on: 05/31/2020 04:06 PM   Modules accepted: Orders

## 2020-06-01 LAB — HEPATITIS C ANTIBODY
Hepatitis C Ab: NONREACTIVE
SIGNAL TO CUT-OFF: 0.01 (ref <1.00)

## 2020-06-03 LAB — QUANTIFERON-TB GOLD PLUS
Mitogen-NIL: >10 IU/mL
NIL: 0.03 IU/mL
QuantiFERON-TB Gold Plus: NEGATIVE
TB1-NIL: 0 IU/mL
TB2-NIL: 0.01 IU/mL

## 2020-07-04 DIAGNOSIS — Z87898 Personal history of other specified conditions: Secondary | ICD-10-CM

## 2020-07-05 ENCOUNTER — Other Ambulatory Visit: Payer: Self-pay | Admitting: Primary Care

## 2020-07-05 MED ORDER — SCOPOLAMINE 1 MG/3DAYS TD PT72: 1.0000 | MEDICATED_PATCH | TRANSDERMAL | 0 refills | Status: DC

## 2020-08-08 ENCOUNTER — Other Ambulatory Visit: Payer: Self-pay | Admitting: Primary Care

## 2020-08-08 ENCOUNTER — Ambulatory Visit (INDEPENDENT_AMBULATORY_CARE_PROVIDER_SITE_OTHER): Payer: No Typology Code available for payment source | Admitting: Primary Care

## 2020-08-08 ENCOUNTER — Other Ambulatory Visit: Payer: Self-pay

## 2020-08-08 ENCOUNTER — Encounter: Payer: Self-pay | Admitting: Primary Care

## 2020-08-08 ENCOUNTER — Other Ambulatory Visit (HOSPITAL_COMMUNITY)
Admission: RE | Admit: 2020-08-08 | Discharge: 2020-08-08 | Disposition: A | Discharge status: Home / Self Care | Payer: No Typology Code available for payment source | Source: Ambulatory Visit | Attending: Primary Care | Admitting: Primary Care

## 2020-08-08 VITALS — BP 128/68 | HR 49 | Temp 97.5°F | Ht 64.25 in | Wt 173.0 lb

## 2020-08-08 DIAGNOSIS — Z124 Encounter for screening for malignant neoplasm of cervix: Secondary | ICD-10-CM | POA: Diagnosis not present

## 2020-08-08 DIAGNOSIS — F411 Generalized anxiety disorder: Secondary | ICD-10-CM

## 2020-08-08 MED ORDER — SERTRALINE HCL 50 MG PO TABS: 50.0000 mg | ORAL_TABLET | Freq: Every day | ORAL | 1 refills | Status: DC

## 2020-08-08 NOTE — Patient Instructions (Signed)
Start sertraline (Zoloft) 50 mg for anxiety. Start with 1/2 tablet once daily for 6-8 days, then increase to 1 full tablet thereafter.  You are due for repeat A1C in early January 2022.  Please schedule a follow up visit for 6 weeks for follow up of anxiety.  It was a pleasure to see you today!

## 2020-08-08 NOTE — Assessment & Plan Note (Signed)
No pelvic or vaginal symptoms. Last pap in 2018 negative. Repeat pap smear completed today.

## 2020-08-08 NOTE — Progress Notes (Signed)
Subjective:    Patient ID: Kelly Sparks, female    DOB: 1975/09/18, 44 y.o.   MRN: [id number]  HPI  This visit occurred during the SARS-CoV-2 public health emergency.  Safety protocols were in place, including screening questions prior to the visit, additional usage of staff PPE, and extensive cleaning of exam room while observing appropriate contact time as indicated for disinfecting solutions.   Kelly Sparks is a 44 year old female who presents today for pap smear and to discuss anxiety.  Last pap smear was in 2018, negative. She is due today. Denies vaginal and pelvic symptoms.  Chronic anxiety history, feels anxious and worries often. Also with symptoms of difficulty focusing, staying on track. 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 She's never been diagnosed with ADHD. She was once on fluoxetine 20 mg in 2018 for anxiety and depression symptoms, didn't like the way she felt as it caused fatigue, jittery feeling, waking during the night. She's never tried anything else for anxiety. Is ready for treatment.   BP Readings from Last 3 Encounters:  08/08/20 128/68  05/30/20 132/70  04/09/20 105/86      Review of Systems  Genitourinary: Negative for pelvic pain and vaginal discharge.  Psychiatric/Behavioral: The patient is nervous/anxious.        See HPI       Past Medical History:  Diagnosis Date  . Acid reflux   . Alopecia   . BRCA negative 05/2017   BRCA/MyRisk except POLE VUS  . Family history of breast cancer 05/2017   MyRisk neg; IBIS=14%  . Hemorrhoids   . Rectal bleeding 02/10/2013     Social History   Socioeconomic History  . Marital status: Married    Spouse name: Not on file  . Number of children: Not on file  . Years of education: Not on file  . Highest education level: Not on file  Occupational History  . Not on file  Tobacco Use  . Smoking status: Never Smoker  . Smokeless tobacco: Never Used  Vaping  Use  . Vaping Use: Never used  Substance and Sexual Activity  . Alcohol use: Yes    Alcohol/week: 1.0 standard drink    Types: 1 Standard drinks or equivalent per week    Comment: occasional - 1 mixed drink/mo  . Drug use: No  . Sexual activity: Yes    Birth control/protection: Surgical    Comment: vasectomy  Other Topics Concern  . Not on file  Social History Narrative  . Not on file   Social Determinants of Health   Financial Resource Strain:   . Difficulty of Paying Living Expenses: Not on file  Food Insecurity:   . Worried About Charity fundraiser in the Last Year: Not on file  . Ran Out of Food in the Last Year: Not on file  Transportation Needs:   . Lack of Transportation (Medical): Not on file  . Lack of Transportation (Non-Medical): Not on file  Physical Activity:   . Days of Exercise per Week: Not on file  . Minutes of Exercise per Session: Not on file  Stress:   . Feeling of Stress : Not on file  Social Connections:   . Frequency of Communication with Friends and Family: Not on file  . Frequency of Social Gatherings with Friends and Family: Not on file  . Attends Religious Services: Not on file  . Active Member of Clubs or Organizations:  Not on file  . Attends Archivist Meetings: Not on file  . Marital Status: Not on file  Intimate Partner Violence:   . Fear of Current or Ex-Partner: Not on file  . Emotionally Abused: Not on file  . Physically Abused: Not on file  . Sexually Abused: Not on file    Past Surgical History:  Procedure Laterality Date  . BIOPSY N/A 04/09/2020   Procedure: BIOPSY;  Surgeon: Virgel Manifold, MD;  Location: [facility];  Service: Endoscopy;  Laterality: N/A;  . COLONOSCOPY WITH PROPOFOL N/A 04/09/2020   Procedure: COLONOSCOPY WITH PROPOFOL;  Surgeon: Virgel Manifold, MD;  Location: [facility];  Service: Endoscopy;  Laterality: N/A;  . NO PAST SURGERIES    . TONSILLECTOMY Bilateral 10/17/2015    Procedure: TONSILLECTOMY;  Surgeon: Carloyn Manner, MD;  Location: [facility];  Service: ENT;  Laterality: Bilateral;    Family History  Problem Relation Age of Onset  . Breast cancer Maternal Aunt 30  . Uterine cancer Maternal Aunt   . Hyperlipidemia Father   . Hypertension Father   . Lung cancer Father   . Hyperlipidemia Mother   . Hypertension Mother   . Diabetes Maternal Uncle   . Breast cancer Paternal Aunt 71    No Known Allergies  Current Outpatient Medications on File Prior to Visit  Medication Sig Dispense Refill  . cholecalciferol (VITAMIN D3) 25 MCG (1000 UNIT) tablet     . clobetasol ointment (TEMOVATE) 0.04 % Apply 1 application topically as needed.    . Cobalamin Combinations (NEURIVA PLUS) CAPS     . famotidine (PEPCID) 20 MG tablet Take 1 tablet (20 mg total) by mouth 2 (two) times daily. As needed for heartburn. 180 tablet 0  . hydrocortisone (ANUSOL-HC) 25 MG suppository Place 1 suppository (25 mg total) rectally 2 (two) times daily. 12 suppository 0  . Levonorgestrel (KYLEENA) 19.5 MG IUD by Intrauterine route.    . Multiple Vitamin (MULTIVITAMIN) tablet Take 1 tablet by mouth daily.    . Omega-3 Fatty Acids (FISH OIL) 1000 MG CAPS     . polyethylene glycol powder (GLYCOLAX/MIRALAX) powder Take 17 g by mouth daily as needed.    . Probiotic Product (PROBIOTIC-10) CAPS Take 1 tablet by mouth daily.    . valACYclovir (VALTREX) 1000 MG tablet Take 2 tablets twice daily for one day at cold sore onset. 12 tablet 0  . Zinc 100 MG TABS      No current facility-administered medications on file prior to visit.    BP 128/68   Pulse (!) 49   Temp (!) 97.5 F (36.4 C) (Temporal)   Ht 5' 4.25" (1.632 m)   Wt 173 lb (78.5 kg)   SpO2 99%   BMI 29.46 kg/m    Objective:   Physical Exam Cardiovascular:     Rate and Rhythm: Normal rate and regular rhythm.  Pulmonary:     Effort: Pulmonary effort is normal.     Breath sounds: Normal breath sounds.   Genitourinary:    Labia:        Right: No tenderness or lesion.        Left: No tenderness or lesion.      Vagina: No vaginal discharge or erythema.     Cervix: No cervical motion tenderness or discharge.     Uterus: Normal.      Adnexa: Right adnexa normal and left adnexa normal.     Comments: IUD string visible  Musculoskeletal:     Cervical back: Neck supple.  Skin:    General: Skin is warm and dry.  Psychiatric:        Mood and Affect: Mood normal.            Assessment & Plan:

## 2020-08-08 NOTE — Assessment & Plan Note (Signed)
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 Could not tolerate fluoxetine in the past. Will trial Zoloft 50 mg.   Patient is to take 1/2 tablet daily for 6-8 days, then advance to 1 full tablet thereafter. We discussed possible side effects of headache, GI upset, drowsiness, and SI/HI. If thoughts of SI/HI develop, we discussed to present to the emergency immediately. Patient verbalized understanding.   Follow up in 6 weeks.

## 2020-08-09 ENCOUNTER — Other Ambulatory Visit: Payer: Self-pay | Admitting: Primary Care

## 2020-08-09 DIAGNOSIS — R921 Mammographic calcification found on diagnostic imaging of breast: Secondary | ICD-10-CM

## 2020-08-09 LAB — CYTOLOGY - PAP
Comment: NEGATIVE
Diagnosis: NEGATIVE
High risk HPV: NEGATIVE

## 2020-09-04 ENCOUNTER — Other Ambulatory Visit: Payer: Self-pay

## 2020-09-04 ENCOUNTER — Ambulatory Visit
Admission: RE | Admit: 2020-09-04 | Discharge: 2020-09-04 | Disposition: A | Discharge status: Home / Self Care | Payer: No Typology Code available for payment source | Source: Ambulatory Visit | Attending: Primary Care | Admitting: Primary Care

## 2020-09-04 DIAGNOSIS — R921 Mammographic calcification found on diagnostic imaging of breast: Secondary | ICD-10-CM | POA: Insufficient documentation

## 2020-09-19 ENCOUNTER — Other Ambulatory Visit: Payer: Self-pay

## 2020-09-19 ENCOUNTER — Ambulatory Visit (INDEPENDENT_AMBULATORY_CARE_PROVIDER_SITE_OTHER): Payer: No Typology Code available for payment source | Admitting: Primary Care

## 2020-09-19 ENCOUNTER — Encounter: Payer: Self-pay | Admitting: Primary Care

## 2020-09-19 VITALS — BP 110/62 | HR 67 | Temp 97.6°F | Ht 64.25 in | Wt 176.0 lb

## 2020-09-19 DIAGNOSIS — R7303 Prediabetes: Secondary | ICD-10-CM

## 2020-09-19 DIAGNOSIS — F411 Generalized anxiety disorder: Secondary | ICD-10-CM

## 2020-09-19 LAB — POCT GLYCOSYLATED HEMOGLOBIN (HGB A1C): Hemoglobin A1C: 5.3 % (ref 4.0–5.6)

## 2020-09-19 NOTE — Assessment & Plan Note (Signed)
Resolved with A1C today of 5.3!! Commended her on lifestyle changes, continue to monitor.

## 2020-09-19 NOTE — Assessment & Plan Note (Signed)
Very slight improvement on two weeks of Zoloft 25 mg, she increased to 50 mg today.  Continue Zoloft 50 mg, she will update me via My Chart in another three weeks. So far she seems to be improving.

## 2020-09-19 NOTE — Patient Instructions (Signed)
Continue to work on a healthy diet! Ensure you are consuming 64 ounces of water daily.  Continue sertraline (Zoloft) 50 mg daily for anxiety. Please update me via My Chart in 3 weeks.  It was a pleasure to see you today!

## 2020-09-19 NOTE — Progress Notes (Signed)
Subjective:    Patient ID: Kelly Sparks, female    DOB: 1976-08-24, 45 y.o.   MRN: [id number]  HPI  This visit occurred during the SARS-CoV-2 public health emergency.  Safety protocols were in place, including screening questions prior to the visit, additional usage of staff PPE, and extensive cleaning of exam room while observing appropriate contact time as indicated for disinfecting solutions.   Kelly Sparks is a 45 year old female with a history of anxiety who presents today for follow up of anxiety. She is also due for repeat A1C for prediabetes.  She was last evaluated on 08/08/20 for symptoms of anxiety including feeling anxious, worry, difficulty focusing. 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 Given symptoms we initiated Zoloft 50 mg. She was asked to follow up today.  Since her last visit she's been on her Zoloft for about 2 weeks, she waited before taking after our last visit as she was skeptical. She has been taking 25 mg until today when she took the 50 mg tablet. Over the last two weeks she's noticed feeling less anxious. She denies SI/HI, headaches.   A1C of 6.1 in September 2021. At the time she endorsed a poor diet, no exercise. Since then she's been working to change her diet, is more active.    Review of Systems  Respiratory: Negative for shortness of breath.   Cardiovascular: Negative for chest pain.  Neurological: Negative for headaches.  Psychiatric/Behavioral: Negative for suicidal ideas. The patient is nervous/anxious.        See HPI       Past Medical History:  Diagnosis Date  . Acid reflux   . Alopecia   . BRCA negative 05/2017   BRCA/MyRisk except POLE VUS  . Family history of breast cancer 05/2017   MyRisk neg; IBIS=14%  . Hemorrhoids   . Rectal bleeding 02/10/2013     Social History   Socioeconomic History  . Marital status: Married    Spouse name: Not on file  . Number of children: Not on file  .  Years of education: Not on file  . Highest education level: Not on file  Occupational History  . Not on file  Tobacco Use  . Smoking status: Never Smoker  . Smokeless tobacco: Never Used  Vaping Use  . Vaping Use: Never used  Substance and Sexual Activity  . Alcohol use: Yes    Alcohol/week: 1.0 standard drink    Types: 1 Standard drinks or equivalent per week    Comment: occasional - 1 mixed drink/mo  . Drug use: No  . Sexual activity: Yes    Birth control/protection: Surgical    Comment: vasectomy  Other Topics Concern  . Not on file  Social History Narrative  . Not on file   Social Determinants of Health   Financial Resource Strain: Not on file  Food Insecurity: Not on file  Transportation Needs: Not on file  Physical Activity: Not on file  Stress: Not on file  Social Connections: Not on file  Intimate Partner Violence: Not on file    Past Surgical History:  Procedure Laterality Date  . BIOPSY N/A 04/09/2020   Procedure: BIOPSY;  Surgeon: Virgel Manifold, MD;  Location: [facility];  Service: Endoscopy;  Laterality: N/A;  . COLONOSCOPY WITH PROPOFOL N/A 04/09/2020   Procedure: COLONOSCOPY WITH PROPOFOL;  Surgeon: Virgel Manifold, MD;  Location: [facility];  Service: Endoscopy;  Laterality: N/A;  .  NO PAST SURGERIES    . TONSILLECTOMY Bilateral 10/17/2015   Procedure: TONSILLECTOMY;  Surgeon: Carloyn Manner, MD;  Location: [facility];  Service: ENT;  Laterality: Bilateral;    Family History  Problem Relation Age of Onset  . Breast cancer Maternal Aunt 30  . Uterine cancer Maternal Aunt   . Hyperlipidemia Father   . Hypertension Father   . Lung cancer Father   . Hyperlipidemia Mother   . Hypertension Mother   . Diabetes Maternal Uncle   . Breast cancer Paternal Aunt 4    No Known Allergies  Current Outpatient Medications on File Prior to Visit  Medication Sig Dispense Refill  . cholecalciferol (VITAMIN D3) 25 MCG (1000  UNIT) tablet     . clobetasol ointment (TEMOVATE) 5.67 % Apply 1 application topically as needed.    . Cobalamin Combinations (NEURIVA PLUS) CAPS     . famotidine (PEPCID) 20 MG tablet Take 1 tablet (20 mg total) by mouth 2 (two) times daily. As needed for heartburn. 180 tablet 0  . hydrocortisone (ANUSOL-HC) 25 MG suppository Place 1 suppository (25 mg total) rectally 2 (two) times daily. 12 suppository 0  . levonorgestrel (KYLEENA) 19.5 MG IUD by Intrauterine route.    . Multiple Vitamin (MULTIVITAMIN) tablet Take 1 tablet by mouth daily.    . Omega-3 Fatty Acids (FISH OIL) 1000 MG CAPS     . polyethylene glycol powder (GLYCOLAX/MIRALAX) powder Take 17 g by mouth daily as needed.    . Probiotic Product (PROBIOTIC-10) CAPS Take 1 tablet by mouth daily.    . sertraline (ZOLOFT) 50 MG tablet Take 1 tablet (50 mg total) by mouth daily. For anxiety. 30 tablet 1  . valACYclovir (VALTREX) 1000 MG tablet Take 2 tablets twice daily for one day at cold sore onset. 12 tablet 0  . Zinc 100 MG TABS      No current facility-administered medications on file prior to visit.    BP 110/62   Pulse 67   Temp 97.6 F (36.4 C) (Temporal)   Ht 5' 4.25" (1.632 m)   Wt 176 lb (79.8 kg)   LMP 08/21/2020   SpO2 98%   BMI 29.98 kg/m    Objective:   Physical Exam Constitutional:      Appearance: She is well-nourished.  Cardiovascular:     Rate and Rhythm: Normal rate and regular rhythm.  Pulmonary:     Effort: Pulmonary effort is normal.     Breath sounds: Normal breath sounds.  Musculoskeletal:     Cervical back: Neck supple.  Skin:    General: Skin is warm and dry.  Psychiatric:        Mood and Affect: Mood and affect normal.            Assessment & Plan:

## 2020-10-20 DIAGNOSIS — F411 Generalized anxiety disorder: Secondary | ICD-10-CM

## 2020-10-21 ENCOUNTER — Other Ambulatory Visit: Payer: Self-pay | Admitting: Primary Care

## 2020-10-21 MED ORDER — SERTRALINE HCL 50 MG PO TABS: 50.0000 mg | ORAL_TABLET | Freq: Every day | ORAL | 2 refills | Status: DC

## 2021-02-03 MED FILL — Sertraline HCl Tab 50 MG: ORAL | 90 days supply | Qty: 90 | Fill #0 | Status: AC

## 2021-02-04 ENCOUNTER — Other Ambulatory Visit: Payer: Self-pay

## 2021-02-20 ENCOUNTER — Other Ambulatory Visit: Payer: Self-pay

## 2021-02-20 ENCOUNTER — Ambulatory Visit (INDEPENDENT_AMBULATORY_CARE_PROVIDER_SITE_OTHER): Payer: No Typology Code available for payment source | Admitting: Primary Care

## 2021-02-20 VITALS — BP 118/80 | HR 63 | Temp 98.0°F | Ht 64.0 in | Wt 185.0 lb

## 2021-02-20 DIAGNOSIS — E669 Obesity, unspecified: Secondary | ICD-10-CM

## 2021-02-20 MED ORDER — TOPIRAMATE 50 MG PO TABS
50.0000 mg | ORAL_TABLET | Freq: Every day | ORAL | 0 refills | Status: DC
  Filled 2021-02-20: qty 90, 90d supply, fill #0

## 2021-02-20 NOTE — Assessment & Plan Note (Signed)
Agree to resume Topamax 50 mg as she was successful on this in the past. I did strongly advise her to change her diet, find time to meal prep, take healthier snacks to school. Continue exercising.  We will try this for 3 months and see her back at that time. She agrees.

## 2021-02-20 NOTE — Patient Instructions (Signed)
Continue exercising. You should be getting 150 minutes of moderate intensity exercise weekly.  It's important to improve your diet by reducing consumption of fast food, fried food, processed snack foods, sugary drinks. Increase consumption of fresh vegetables and fruits, whole grains, water.  Ensure you are drinking 64 ounces of water daily.  Please schedule a follow up visit for 3 months.  It was a pleasure to see you today!

## 2021-02-20 NOTE — Progress Notes (Signed)
Subjective:    Patient ID: Kelly Sparks, female    DOB: 11-29-1975, 45 y.o.   MRN: [id number]  HPI  Kelly Sparks is a very pleasant 45 y.o. female with a history of prediabetes, obesity who presents today to discuss obesity.   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 She has found it to be difficult to meal prep, endorses a poor diet. Is eating late at night. Fast food, snacking on junk food. She is exercising five days weekly for the most part.   Diet currently consists of:  Breakfast: Smoothie Lunch: Wrap (chicken salad/turkey) Dinner: Fast food Snacks: Chips, crackers, cookies  Desserts: 2-3 times weekly  Beverages: Diet soda, water, crystal light.   Exercise: Five days weekly, 15-30 minutes.   She was once managed on Topamax which curbed her appetite, she took this off and on for about one year. She had no issues on this medication, would like to resume.   She has an appointment scheduled with a weight management person with her GYN for early July.    Wt Readings from Last 3 Encounters:  02/20/21 185 lb (83.9 kg)  09/19/20 176 lb (79.8 kg)  08/08/20 173 lb (78.5 kg)        Review of Systems  Eyes:  Negative for visual disturbance.  Respiratory:  Negative for shortness of breath.   Cardiovascular:  Negative for chest pain.  Neurological:  Negative for headaches.        Past Medical History:  Diagnosis Date   Acid reflux    Alopecia    BRCA negative 05/2017   BRCA/MyRisk except POLE VUS   Family history of breast cancer 05/2017   MyRisk neg; IBIS=14%   Hemorrhoids    Rectal bleeding 02/10/2013    Social History   Socioeconomic History   Marital status: Married    Spouse name: Not on file   Number of children: Not on file   Years of education: Not on file   Highest education level: Not on file  Occupational History   Not on file  Tobacco Use   Smoking status: Never    Smokeless tobacco: Never  Vaping Use   Vaping Use: Never used  Substance and Sexual Activity   Alcohol use: Yes    Alcohol/week: 1.0 standard drink    Types: 1 Standard drinks or equivalent per week    Comment: occasional - 1 mixed drink/mo   Drug use: No   Sexual activity: Yes    Birth control/protection: Surgical    Comment: vasectomy  Other Topics Concern   Not on file  Social History Narrative   Not on file   Social Determinants of Health   Financial Resource Strain: Not on file  Food Insecurity: Not on file  Transportation Needs: Not on file  Physical Activity: Not on file  Stress: Not on file  Social Connections: Not on file  Intimate Partner Violence: Not on file    Past Surgical History:  Procedure Laterality Date   BIOPSY N/A 04/09/2020   Procedure: BIOPSY;  Surgeon: Virgel Manifold, MD;  Location: [facility];  Service: Endoscopy;  Laterality: N/A;   COLONOSCOPY WITH PROPOFOL N/A 04/09/2020   Procedure: COLONOSCOPY WITH PROPOFOL;  Surgeon: Virgel Manifold, MD;  Location: [facility];  Service: Endoscopy;  Laterality: N/A;   NO PAST SURGERIES     TONSILLECTOMY Bilateral 10/17/2015   Procedure:  TONSILLECTOMY;  Surgeon: Carloyn Manner, MD;  Location: [facility];  Service: ENT;  Laterality: Bilateral;    Family History  Problem Relation Age of Onset   Breast cancer Maternal Aunt 74   Uterine cancer Maternal Aunt    Hyperlipidemia Father    Hypertension Father    Lung cancer Father    Hyperlipidemia Mother    Hypertension Mother    Diabetes Maternal Uncle    Breast cancer Paternal Aunt 35    No Known Allergies  Current Outpatient Medications on File Prior to Visit  Medication Sig Dispense Refill   cholecalciferol (VITAMIN D3) 25 MCG (1000 UNIT) tablet      clobetasol ointment (TEMOVATE) 5.48 % Apply 1 application topically as needed.     Cobalamin Combinations (NEURIVA PLUS) CAPS      famotidine (PEPCID) 20 MG tablet Take  1 tablet (20 mg total) by mouth 2 (two) times daily. As needed for heartburn. 180 tablet 0   hydrocortisone (ANUSOL-HC) 25 MG suppository Place 1 suppository (25 mg total) rectally 2 (two) times daily. 12 suppository 0   levonorgestrel (KYLEENA) 19.5 MG IUD by Intrauterine route.     Multiple Vitamin (MULTIVITAMIN) tablet Take 1 tablet by mouth daily.     Omega-3 Fatty Acids (FISH OIL) 1000 MG CAPS      polyethylene glycol powder (GLYCOLAX/MIRALAX) powder Take 17 g by mouth daily as needed.     Probiotic Product (PROBIOTIC-10) CAPS Take 1 tablet by mouth daily.     sertraline (ZOLOFT) 50 MG tablet TAKE 1 TABLET BY MOUTH DAILY FOR ANXIETY 90 tablet 2   valACYclovir (VALTREX) 1000 MG tablet Take 2 tablets twice daily for one day at cold sore onset. 12 tablet 0   Zinc 100 MG TABS      No current facility-administered medications on file prior to visit.    BP 118/80   Pulse 63   Temp 98 F (36.7 C) (Temporal)   Ht 5' 4"  (1.626 m)   Wt 185 lb (83.9 kg)   SpO2 99%   BMI 31.76 kg/m  Objective:   Physical Exam Cardiovascular:     Rate and Rhythm: Normal rate and regular rhythm.  Pulmonary:     Effort: Pulmonary effort is normal.     Breath sounds: Normal breath sounds.  Musculoskeletal:     Cervical back: Neck supple.  Skin:    General: Skin is warm and dry.          Assessment & Plan:      This visit occurred during the SARS-CoV-2 public health emergency.  Safety protocols were in place, including screening questions prior to the visit, additional usage of staff PPE, and extensive cleaning of exam room while observing appropriate contact time as indicated for disinfecting solutions.

## 2021-03-06 ENCOUNTER — Ambulatory Visit (INDEPENDENT_AMBULATORY_CARE_PROVIDER_SITE_OTHER): Payer: No Typology Code available for payment source | Admitting: Certified Nurse Midwife

## 2021-03-06 ENCOUNTER — Encounter: Payer: Self-pay | Admitting: Certified Nurse Midwife

## 2021-03-06 ENCOUNTER — Other Ambulatory Visit: Payer: Self-pay

## 2021-03-06 VITALS — BP 124/81 | HR 73 | Ht 64.0 in | Wt 184.1 lb

## 2021-03-06 DIAGNOSIS — Z713 Dietary counseling and surveillance: Secondary | ICD-10-CM | POA: Diagnosis not present

## 2021-03-06 DIAGNOSIS — E669 Obesity, unspecified: Secondary | ICD-10-CM | POA: Diagnosis not present

## 2021-03-06 MED ORDER — PHENTERMINE HCL 37.5 MG PO TABS
37.5000 mg | ORAL_TABLET | Freq: Every day | ORAL | 0 refills | Status: DC
Start: 2021-03-06 — End: 2021-04-03
  Filled 2021-03-06: qty 30, 30d supply, fill #0

## 2021-03-06 MED ORDER — CYANOCOBALAMIN 1000 MCG/ML IJ SOLN
1000.0000 ug | Freq: Once | INTRAMUSCULAR | 5 refills | Status: AC
  Filled 2021-03-06: qty 1, 1d supply, fill #0
  Filled 2021-04-15: qty 1, 1d supply, fill #1

## 2021-03-06 MED ORDER — CYANOCOBALAMIN 1000 MCG/ML IJ SOLN
1000.0000 ug | Freq: Once | INTRAMUSCULAR | 5 refills | Status: DC
  Filled 2021-03-06: qty 1, 1d supply, fill #0

## 2021-03-06 MED ORDER — CYANOCOBALAMIN 1000 MCG/ML IJ SOLN
1000.0000 ug | Freq: Once | INTRAMUSCULAR | Status: AC
Start: 2021-03-06 — End: 2021-03-06
  Administered 2021-03-06: 1000 ug via INTRAMUSCULAR

## 2021-03-06 NOTE — Patient Instructions (Signed)
Preventive Care 68-45 Years Old, Female Preventive care refers to lifestyle choices and visits with your health care provider that can promote health and wellness. This includes: A yearly physical exam. This is also called an annual wellness visit. Regular dental and eye exams. Immunizations. Screening for certain conditions. Healthy lifestyle choices, such as: Eating a healthy diet. Getting regular exercise. Not using drugs or products that contain nicotine and tobacco. Limiting alcohol use. What can I expect for my preventive care visit? Physical exam Your health care provider will check your: Height and weight. These may be used to calculate your BMI (body mass index). BMI is a measurement that tells if you are at a healthy weight. Heart rate and blood pressure. Body temperature. Skin for abnormal spots. Counseling Your health care provider may ask you questions about your: Past medical problems. Family's medical history. Alcohol, tobacco, and drug use. Emotional well-being. Home life and relationship well-being. Sexual activity. Diet, exercise, and sleep habits. Work and work Statistician. Access to firearms. Method of birth control. Menstrual cycle. Pregnancy history. What immunizations do I need?  Vaccines are usually given at various ages, according to a schedule. Your health care provider will recommend vaccines for you based on your age, medicalhistory, and lifestyle or other factors, such as travel or where you work. What tests do I need? Blood tests Lipid and cholesterol levels. These may be checked every 5 years, or more often if you are over 37 years old. Hepatitis C test. Hepatitis B test. Screening Lung cancer screening. You may have this screening every year starting at age 30 if you have a 30-pack-year history of smoking and currently smoke or have quit within the past 15 years. Colorectal cancer screening. All adults should have this screening starting at  age 23 and continuing until age 3. Your health care provider may recommend screening at age 88 if you are at increased risk. You will have tests every 1-10 years, depending on your results and the type of screening test. Diabetes screening. This is done by checking your blood sugar (glucose) after you have not eaten for a while (fasting). You may have this done every 1-3 years. Mammogram. This may be done every 1-2 years. Talk with your health care provider about when you should start having regular mammograms. This may depend on whether you have a family history of breast cancer. BRCA-related cancer screening. This may be done if you have a family history of breast, ovarian, tubal, or peritoneal cancers. Pelvic exam and Pap test. This may be done every 3 years starting at age 79. Starting at age 54, this may be done every 5 years if you have a Pap test in combination with an HPV test. Other tests STD (sexually transmitted disease) testing, if you are at risk. Bone density scan. This is done to screen for osteoporosis. You may have this scan if you are at high risk for osteoporosis. Talk with your health care provider about your test results, treatment options,and if necessary, the need for more tests. Follow these instructions at home: Eating and drinking  Eat a diet that includes fresh fruits and vegetables, whole grains, lean protein, and low-fat dairy products. Take vitamin and mineral supplements as recommended by your health care provider. Do not drink alcohol if: Your health care provider tells you not to drink. You are pregnant, may be pregnant, or are planning to become pregnant. If you drink alcohol: Limit how much you have to 0-1 drink a day. Be aware  of how much alcohol is in your drink. In the [place] one drink equals one 12 oz bottle of beer (355 mL), one 5 oz glass of wine (148 mL), or one 1 oz glass of hard liquor (44 mL).  Lifestyle Take daily care of your teeth and  gums. Brush your teeth every morning and night with fluoride toothpaste. Floss one time each day. Stay active. Exercise for at least 30 minutes 5 or more days each week. Do not use any products that contain nicotine or tobacco, such as cigarettes, e-cigarettes, and chewing tobacco. If you need help quitting, ask your health care provider. Do not use drugs. If you are sexually active, practice safe sex. Use a condom or other form of protection to prevent STIs (sexually transmitted infections). If you do not wish to become pregnant, use a form of birth control. If you plan to become pregnant, see your health care provider for a prepregnancy visit. If told by your health care provider, take low-dose aspirin daily starting at age 29. Find healthy ways to cope with stress, such as: Meditation, yoga, or listening to music. Journaling. Talking to a trusted person. Spending time with friends and family. Safety Always wear your seat belt while driving or riding in a vehicle. Do not drive: If you have been drinking alcohol. Do not ride with someone who has been drinking. When you are tired or distracted. While texting. Wear a helmet and other protective equipment during sports activities. If you have firearms in your house, make sure you follow all gun safety procedures. What's next? Visit your health care provider once a year for an annual wellness visit. Ask your health care provider how often you should have your eyes and teeth checked. Stay up to date on all vaccines. This information is not intended to replace advice given to you by your health care provider. Make sure you discuss any questions you have with your healthcare provider. Document Revised: 05/22/2020 Document Reviewed: 04/29/2018 Elsevier Patient Education  2022 [employer].

## 2021-03-06 NOTE — Progress Notes (Signed)
Subjective:  Kelly Sparks is a 45 y.o. [id number] at Unknown being seen today for weight loss management- initial visit.  Patient reports General ROS: negative and reports previous weight loss attempts: has used phentermine in the past at Kelly Sparks weight loss. Was sucessful.  Management changes made at the last visit include:  adding medication,  Onset was gradual over the past 2 yrs   Onset followed:  Starting nursing school.  Associated symptoms include: fatigue, depression, a change in clothing Sparks Previous/Current treatment includes: small frequent feedings,   Pertinent medical history includes: none  Risk factors include: difficulty getting exercise in daily life.   The patient has a surgical history [id number]  Pertinent social history includes: none Lab evaluation has included: CMP, HemA1C, TSH, Lipid  Past treatment has included: small frequent feedings-portion control, n, vitamin B-12 injections, appetite stimulant, exercise management . She declines nutritional consult at this time.   The following portions of the patient's history were reviewed and updated as appropriate: allergies, current medications, past family history, past medical history, past social history, past surgical history and problem list.   Objective:   Vitals:   03/06/21 1503  BP: 124/81  Pulse: 73  Weight: 184 lb 1.6 oz (83.5 kg)  Height: 5\' 4"  (1.626 m)    General:  Alert, oriented and cooperative. Patient is in no acute distress.  PE: Well groomed female in no current distress,   Mental Status: Normal mood and affect. Normal behavior. Normal judgment and thought content.   Current BMI: Body mass index is 31.6 kg/m.   Assessment and Plan:  Obesity  1. Women's annual routine gynecological examination Pt denies any contraidications to use of medication. Handouts given on medication use and side effects. Discussed diet and exercise to be successful .  PMP Aware Website: , 23F Refine  Search Contact the [facility] Knowledge/Help Sparks Date of Birth: [birth year] Recent Address: [address] [place], [place] View Linked Records (1) Other Tools/Metrics [facility] Report generated on 03/06/2021. Report Date Range: 03/07/2019 - 03/06/2021 PDF Report Export [facility] Scores Narcotic 000 Sedative 000 Stimulant 030 Explanation and Guidance Overdose Risk Score 000 (Range 000-999) Explanation and Guidance State Indicators (0)  5% wt loss 3 mont goal 9.2 lbs   Plan: low carb, High protein diet RX for adipex 37.5 mg daily and B12 05/07/2021.ml monthly, to start now with first injection given at today's visit. Reviewed side-effects common to both medications and expected outcomes. Increase daily water intake to at least 8 bottle a day, every day.  Goal is to reduse weight by 10% by end of three months, and will re-evaluate then.  RTC in 4 weeks for Nurse visit to check weight & BP, and get next B12 injections.    Please refer to After Visit Summary for other counseling recommendations.    , Kelly Sparks    Consider the Low Glycemic Index Diet and 6 smaller meals daily .  This boosts your metabolism and regulates your sugars:   Use the protein bar by Kelly Sparks because they have lots of fiber in them  Find the low carb flatbreads, tortillas and pita breads for sandwiches:  Kelly Sparks makes a pita bread and a flat bread , available at Kelly Sparks and Kelly Sparks; Kelly Sparks makes a low carb flatbread available at Kelly Sparks and Kelly Sparks that is 9 net carbs and 100 cal Kelly Sparks makes a low carb whole wheat tortilla available at Kelly Sparks most grocery stores with 6 net carbs and 210 cal  Greek yogurt can still have a lot of carbs .  Kelly Sparks has 80 cal and 8 carbs

## 2021-03-06 NOTE — Addendum Note (Signed)
Addended by: Lady Deutscher on: 03/06/2021 04:23 PM   Modules accepted: Orders

## 2021-03-07 LAB — LIPID PANEL
Chol/HDL Ratio: 2.4 ratio (ref 0.0–4.4)
Cholesterol, Total: 178 mg/dL (ref 100–199)
HDL: 73 mg/dL (ref >39)
LDL Chol Calc (NIH): 95 mg/dL (ref 0–99)
Triglycerides: 51 mg/dL (ref 0–149)
VLDL Cholesterol Cal: 10 mg/dL (ref 5–40)

## 2021-03-07 LAB — THYROID PANEL WITH TSH
Free Thyroxine Index: 1.9 (ref 1.2–4.9)
T3 Uptake Ratio: 25 % (ref 24–39)
T4, Total: 7.5 ug/dL (ref 4.5–12.0)
TSH: 0.827 u[IU]/mL (ref 0.450–4.500)

## 2021-03-07 LAB — HEMOGLOBIN A1C
Est. average glucose Bld gHb Est-mCnc: 117 mg/dL
Hgb A1c MFr Bld: 5.7 % — ABNORMAL HIGH (ref 4.8–5.6)

## 2021-03-07 LAB — COMPREHENSIVE METABOLIC PANEL
ALT: 13 IU/L (ref 0–32)
AST: 18 IU/L (ref 0–40)
Albumin/Globulin Ratio: 1.8 (ref 1.2–2.2)
Albumin: 4.4 g/dL (ref 3.8–4.8)
Alkaline Phosphatase: 83 IU/L (ref 44–121)
BUN/Creatinine Ratio: 15 (ref 9–23)
BUN: 12 mg/dL (ref 6–24)
Bilirubin Total: 0.5 mg/dL (ref 0.0–1.2)
CO2: 22 mmol/L (ref 20–29)
Calcium: 9.3 mg/dL (ref 8.7–10.2)
Chloride: 99 mmol/L (ref 96–106)
Creatinine, Ser: 0.8 mg/dL (ref 0.57–1.00)
Globulin, Total: 2.4 g/dL (ref 1.5–4.5)
Glucose: 110 mg/dL — ABNORMAL HIGH (ref 65–99)
Potassium: 4.1 mmol/L (ref 3.5–5.2)
Sodium: 135 mmol/L (ref 134–144)
Total Protein: 6.8 g/dL (ref 6.0–8.5)
eGFR: 93 mL/min/{1.73_m2} (ref >59)

## 2021-04-03 ENCOUNTER — Encounter: Payer: Self-pay | Admitting: Certified Nurse Midwife

## 2021-04-03 ENCOUNTER — Ambulatory Visit (INDEPENDENT_AMBULATORY_CARE_PROVIDER_SITE_OTHER): Payer: No Typology Code available for payment source | Admitting: Certified Nurse Midwife

## 2021-04-03 ENCOUNTER — Other Ambulatory Visit: Payer: Self-pay

## 2021-04-03 VITALS — BP 117/88 | HR 84 | Ht 64.0 in | Wt 178.3 lb

## 2021-04-03 DIAGNOSIS — E669 Obesity, unspecified: Secondary | ICD-10-CM | POA: Diagnosis not present

## 2021-04-03 DIAGNOSIS — Z713 Dietary counseling and surveillance: Secondary | ICD-10-CM

## 2021-04-03 DIAGNOSIS — Z6830 Body mass index (BMI) 30.0-30.9, adult: Secondary | ICD-10-CM

## 2021-04-03 MED ORDER — CYANOCOBALAMIN 1000 MCG/ML IJ SOLN
1000.0000 ug | Freq: Once | INTRAMUSCULAR | Status: AC
Start: 2021-04-03 — End: 2021-04-03
  Administered 2021-04-03: 1000 ug via INTRAMUSCULAR

## 2021-04-03 MED ORDER — PHENTERMINE HCL 37.5 MG PO TABS
37.5000 mg | ORAL_TABLET | Freq: Every day | ORAL | 0 refills | Status: DC
  Filled 2021-04-03 – 2021-04-15 (×2): qty 30, 30d supply, fill #0

## 2021-04-03 NOTE — Progress Notes (Signed)
SUBJECTIVE:  45 y.o. here for follow-up weight loss visit, previously seen 4 weeks ago. Denies any concerns and feels like medication is working well. She denies any complications or negative side effect with use of medication. She states she has modified her snacking, being more mindful. She has increased her exercise as well to 5 times a week for 30 min. She lost 6 lbs this past month   OBJECTIVE:  BP 117/88   Pulse 84   Ht 5\' 4"  (1.626 m)   Wt 178 lb 4.8 oz (80.9 kg)   BMI 30.61 kg/m   Body mass index is 30.61 kg/m. Waist: 32.75  Patient appears well. ASSESSMENT:  Obesity- responding well to weight loss plan PLAN:  To continue with current medications. B12 1020mcg/ml injection given RTC in 4 weeks as planned  80m, CNM

## 2021-04-03 NOTE — Patient Instructions (Signed)

## 2021-04-15 ENCOUNTER — Other Ambulatory Visit: Payer: Self-pay

## 2021-05-01 ENCOUNTER — Other Ambulatory Visit: Payer: Self-pay

## 2021-05-01 ENCOUNTER — Encounter: Payer: Self-pay | Admitting: Certified Nurse Midwife

## 2021-05-01 ENCOUNTER — Ambulatory Visit (INDEPENDENT_AMBULATORY_CARE_PROVIDER_SITE_OTHER): Payer: No Typology Code available for payment source | Admitting: Certified Nurse Midwife

## 2021-05-01 VITALS — BP 126/86 | HR 74 | Ht 64.0 in | Wt 180.0 lb

## 2021-05-01 DIAGNOSIS — Z713 Dietary counseling and surveillance: Secondary | ICD-10-CM | POA: Diagnosis not present

## 2021-05-01 MED ORDER — CARESTART COVID-19 HOME TEST VI KIT
PACK | 0 refills | Status: DC
  Filled 2021-05-01: qty 2, 4d supply, fill #0

## 2021-05-01 MED ORDER — CYANOCOBALAMIN 1000 MCG/ML IJ SOLN
1000.0000 ug | Freq: Once | INTRAMUSCULAR | Status: AC
  Administered 2021-05-01: 1000 ug via INTRAMUSCULAR

## 2021-05-01 MED ORDER — PHENTERMINE HCL 37.5 MG PO TABS
37.5000 mg | ORAL_TABLET | Freq: Every day | ORAL | 0 refills | Status: DC
  Filled 2021-05-01 – 2021-05-13 (×2): qty 30, 30d supply, fill #0

## 2021-05-01 NOTE — Patient Instructions (Signed)

## 2021-05-01 NOTE — Progress Notes (Signed)
SUBJECTIVE:  45 y.o. here for follow-up weight loss visit, previously seen 4 weeks ago. Denies any concerns and feels like medication is working well. She denies any negative side effect from use. She gained 2 lbs this past month. She states she has not been as strict with her diet and exercise the past month due to life circumstances.  Encouragement given. Discussed 3 month goal. She verbalize and agrees.   OBJECTIVE:  BP 126/86   Pulse 74   Ht 5\' 4"  (1.626 m)   Wt 180 lb (81.6 kg)   BMI 30.90 kg/m   Body mass index is 30.9 kg/m. Waist:32.25   Patient appears well. ASSESSMENT:  Obesity- responding well to weight loss plan PLAN:  To continue with current medications. B12 1078mcg/ml injection given RTC in 4 weeks as planned  80m, CNM

## 2021-05-07 DIAGNOSIS — Z111 Encounter for screening for respiratory tuberculosis: Secondary | ICD-10-CM

## 2021-05-08 ENCOUNTER — Other Ambulatory Visit: Payer: Self-pay

## 2021-05-08 ENCOUNTER — Encounter: Payer: No Typology Code available for payment source | Admitting: Certified Nurse Midwife

## 2021-05-08 ENCOUNTER — Other Ambulatory Visit (INDEPENDENT_AMBULATORY_CARE_PROVIDER_SITE_OTHER): Payer: No Typology Code available for payment source

## 2021-05-08 DIAGNOSIS — Z111 Encounter for screening for respiratory tuberculosis: Secondary | ICD-10-CM | POA: Diagnosis not present

## 2021-05-08 NOTE — Telephone Encounter (Signed)
Can you add patient to lab schedule for today (09/07) anytime after 12 pm?

## 2021-05-11 LAB — QUANTIFERON-TB GOLD PLUS
Mitogen-NIL: >10 IU/mL
NIL: 0.02 IU/mL
QuantiFERON-TB Gold Plus: NEGATIVE
TB1-NIL: 0 IU/mL
TB2-NIL: 0 IU/mL

## 2021-05-13 ENCOUNTER — Other Ambulatory Visit: Payer: Self-pay

## 2021-05-13 MED FILL — Sertraline HCl Tab 50 MG: ORAL | 90 days supply | Qty: 90 | Fill #1 | Status: AC

## 2021-05-29 ENCOUNTER — Encounter: Payer: No Typology Code available for payment source | Admitting: Certified Nurse Midwife

## 2021-06-19 ENCOUNTER — Encounter: Payer: No Typology Code available for payment source | Admitting: Certified Nurse Midwife

## 2021-06-24 ENCOUNTER — Encounter: Payer: No Typology Code available for payment source | Admitting: Certified Nurse Midwife

## 2021-06-24 DIAGNOSIS — Z713 Dietary counseling and surveillance: Secondary | ICD-10-CM

## 2021-08-10 ENCOUNTER — Other Ambulatory Visit: Payer: Self-pay | Admitting: Primary Care

## 2021-08-10 DIAGNOSIS — F411 Generalized anxiety disorder: Secondary | ICD-10-CM

## 2021-08-11 MED ORDER — SERTRALINE HCL 50 MG PO TABS
50.0000 mg | ORAL_TABLET | Freq: Every day | ORAL | 0 refills | Status: DC
  Filled 2021-08-11: qty 60, 60d supply, fill #0

## 2021-08-12 ENCOUNTER — Other Ambulatory Visit: Payer: Self-pay | Admitting: Primary Care

## 2021-08-12 ENCOUNTER — Other Ambulatory Visit: Payer: Self-pay

## 2021-08-12 DIAGNOSIS — K219 Gastro-esophageal reflux disease without esophagitis: Secondary | ICD-10-CM

## 2021-08-12 MED ORDER — FAMOTIDINE 20 MG PO TABS
20.0000 mg | ORAL_TABLET | Freq: Two times a day (BID) | ORAL | 0 refills | Status: DC
  Filled 2021-08-12: qty 180, 90d supply, fill #0

## 2021-08-13 ENCOUNTER — Other Ambulatory Visit: Payer: Self-pay

## 2021-08-13 ENCOUNTER — Other Ambulatory Visit: Payer: Self-pay | Admitting: Primary Care

## 2021-08-13 DIAGNOSIS — R921 Mammographic calcification found on diagnostic imaging of breast: Secondary | ICD-10-CM

## 2021-08-13 DIAGNOSIS — R928 Other abnormal and inconclusive findings on diagnostic imaging of breast: Secondary | ICD-10-CM

## 2021-08-14 ENCOUNTER — Other Ambulatory Visit: Payer: Self-pay | Admitting: Primary Care

## 2021-08-14 DIAGNOSIS — R921 Mammographic calcification found on diagnostic imaging of breast: Secondary | ICD-10-CM

## 2021-09-09 ENCOUNTER — Ambulatory Visit
Admission: RE | Admit: 2021-09-09 | Discharge: 2021-09-09 | Disposition: A | Discharge status: Home / Self Care | Payer: No Typology Code available for payment source | Source: Ambulatory Visit | Attending: Primary Care | Admitting: Primary Care

## 2021-09-09 ENCOUNTER — Other Ambulatory Visit: Payer: Self-pay

## 2021-09-09 DIAGNOSIS — R921 Mammographic calcification found on diagnostic imaging of breast: Secondary | ICD-10-CM | POA: Insufficient documentation

## 2021-09-19 ENCOUNTER — Encounter: Payer: No Typology Code available for payment source | Admitting: Primary Care

## 2021-09-27 ENCOUNTER — Other Ambulatory Visit: Payer: No Typology Code available for payment source

## 2021-10-04 ENCOUNTER — Encounter: Payer: No Typology Code available for payment source | Admitting: Primary Care

## 2021-10-08 ENCOUNTER — Ambulatory Visit: Payer: No Typology Code available for payment source | Admitting: Primary Care

## 2021-10-08 NOTE — Telephone Encounter (Signed)
Called patient made appointment for next week. When we are back in office.

## 2021-10-08 NOTE — Telephone Encounter (Signed)
[facility] Night - Client Nonclinical Telephone Record  [facility] Client [facility] Night - Client Client Site [facility] - Night Provider Vernona Rieger - NP Contact Type Call Who Is Calling Patient / Member / Family / Caregiver Caller Name Yari Szeliga Caller Phone Number [phone] Patient Name Kelly Sparks Patient DOB Aug 11, 1976 Call Type Message Only Information Provided Reason for Call Request to Reschedule Office Appointment Initial Comment Caller needs to see if she can change her appt to a virtual. Patient request to speak to RN No Disp. Time Disposition Final User 10/08/2021 7:59:08 AM General Information Provided Yes Ardelle Anton Call Closed By: Ardelle Anton Transaction Date/Time: 10/08/2021 7:57:12 AM (ET    See note below Joellen CMA has already rescheduled pt.

## 2021-10-16 ENCOUNTER — Encounter: Payer: Self-pay | Admitting: Primary Care

## 2021-10-16 ENCOUNTER — Other Ambulatory Visit: Payer: Self-pay

## 2021-10-16 ENCOUNTER — Ambulatory Visit (INDEPENDENT_AMBULATORY_CARE_PROVIDER_SITE_OTHER): Payer: No Typology Code available for payment source | Admitting: Primary Care

## 2021-10-16 VITALS — BP 118/76 | HR 81 | Temp 98.6°F | Ht 64.0 in | Wt 175.0 lb

## 2021-10-16 DIAGNOSIS — Z803 Family history of malignant neoplasm of breast: Secondary | ICD-10-CM

## 2021-10-16 DIAGNOSIS — F411 Generalized anxiety disorder: Secondary | ICD-10-CM

## 2021-10-16 DIAGNOSIS — B001 Herpesviral vesicular dermatitis: Secondary | ICD-10-CM

## 2021-10-16 DIAGNOSIS — Z0000 Encounter for general adult medical examination without abnormal findings: Secondary | ICD-10-CM | POA: Diagnosis not present

## 2021-10-16 DIAGNOSIS — Z23 Encounter for immunization: Secondary | ICD-10-CM

## 2021-10-16 DIAGNOSIS — K649 Unspecified hemorrhoids: Secondary | ICD-10-CM | POA: Diagnosis not present

## 2021-10-16 DIAGNOSIS — K219 Gastro-esophageal reflux disease without esophagitis: Secondary | ICD-10-CM | POA: Diagnosis not present

## 2021-10-16 DIAGNOSIS — E669 Obesity, unspecified: Secondary | ICD-10-CM

## 2021-10-16 DIAGNOSIS — R7303 Prediabetes: Secondary | ICD-10-CM | POA: Diagnosis not present

## 2021-10-16 LAB — BASIC METABOLIC PANEL
BUN: 13 mg/dL (ref 6–23)
CO2: 29 mEq/L (ref 19–32)
Calcium: 9.3 mg/dL (ref 8.4–10.5)
Chloride: 101 mEq/L (ref 96–112)
Creatinine, Ser: 0.71 mg/dL (ref 0.40–1.20)
GFR: 102.53 mL/min (ref >60.00)
Glucose, Bld: 78 mg/dL (ref 70–99)
Potassium: 3.8 mEq/L (ref 3.5–5.1)
Sodium: 135 mEq/L (ref 135–145)

## 2021-10-16 LAB — LIPID PANEL
Cholesterol: 172 mg/dL (ref 0–200)
HDL: 58.2 mg/dL (ref >39.00)
LDL Cholesterol: 105 mg/dL — ABNORMAL HIGH (ref 0–99)
NonHDL: 113.41
Total CHOL/HDL Ratio: 3
Triglycerides: 40 mg/dL (ref 0.0–149.0)
VLDL: 8 mg/dL (ref 0.0–40.0)

## 2021-10-16 LAB — HEMOGLOBIN A1C: Hgb A1c MFr Bld: 5.8 % (ref 4.6–6.5)

## 2021-10-16 NOTE — Assessment & Plan Note (Signed)
Commended her on weight loss! Encouraged to continue.  Repeat A1C pending. 

## 2021-10-16 NOTE — Assessment & Plan Note (Signed)
Controlled.  Continue sertraline 50 mg daily. 

## 2021-10-16 NOTE — Assessment & Plan Note (Signed)
Mammogram UTD and reviewed in chart.

## 2021-10-16 NOTE — Addendum Note (Signed)
Addended by: Donnamarie Poag on: 10/16/2021 09:53 AM   Modules accepted: Orders

## 2021-10-16 NOTE — Assessment & Plan Note (Signed)
Controlled.  Continue Anusol-HC 25 mg PRN.

## 2021-10-16 NOTE — Assessment & Plan Note (Signed)
Tetanus due, provided today. Other vaccines UTD. Pap smear UTD. Mammogram UTD. Colonoscopy UTD, due 2026.  Discussed the importance of a healthy diet and regular exercise in order for weight loss, and to reduce the risk of further co-morbidity. Commended her on weight loss!  Exam today stable. Labs pending.

## 2021-10-16 NOTE — Assessment & Plan Note (Signed)
Infrequent outbreaks. ? ?Continue Valtrex 1000 mg PRN.  ?

## 2021-10-16 NOTE — Assessment & Plan Note (Signed)
Controlled. Infrequent symptoms.  Continue famotidine 20 mg PRN.

## 2021-10-16 NOTE — Patient Instructions (Signed)
Stop by the lab prior to leaving today. I will notify you of your results once received.  ? ?It was a pleasure to see you today! ? ?Preventive Care 46-46 Years Old, Female ?Preventive care refers to lifestyle choices and visits with your health care provider that can promote health and wellness. Preventive care visits are also called wellness exams. ?What can I expect for my preventive care visit? ?Counseling ?Your health care provider may ask you questions about your: ?Medical history, including: ?Past medical problems. ?Family medical history. ?Pregnancy history. ?Current health, including: ?Menstrual cycle. ?Method of birth control. ?Emotional well-being. ?Home life and relationship well-being. ?Sexual activity and sexual health. ?Lifestyle, including: ?Alcohol, nicotine or tobacco, and drug use. ?Access to firearms. ?Diet, exercise, and sleep habits. ?Work and work environment. ?Sunscreen use. ?Safety issues such as seatbelt and bike helmet use. ?Physical exam ?Your health care provider will check your: ?Height and weight. These may be used to calculate your BMI (body mass index). BMI is a measurement that tells if you are at a healthy weight. ?Waist circumference. This measures the distance around your waistline. This measurement also tells if you are at a healthy weight and may help predict your risk of certain diseases, such as type 2 diabetes and high blood pressure. ?Heart rate and blood pressure. ?Body temperature. ?Skin for abnormal spots. ?What immunizations do I need? ?Vaccines are usually given at various ages, according to a schedule. Your health care provider will recommend vaccines for you based on your age, medical history, and lifestyle or other factors, such as travel or where you work. ?What tests do I need? ?Screening ?Your health care provider may recommend screening tests for certain conditions. This may include: ?Lipid and cholesterol levels. ?Diabetes screening. This is done by checking  your blood sugar (glucose) after you have not eaten for a while (fasting). ?Pelvic exam and Pap test. ?Hepatitis B test. ?Hepatitis C test. ?HIV (human immunodeficiency virus) test. ?STI (sexually transmitted infection) testing, if you are at risk. ?Lung cancer screening. ?Colorectal cancer screening. ?Mammogram. Talk with your health care provider about when you should start having regular mammograms. This may depend on whether you have a family history of breast cancer. ?BRCA-related cancer screening. This may be done if you have a family history of breast, ovarian, tubal, or peritoneal cancers. ?Bone density scan. This is done to screen for osteoporosis. ?Talk with your health care provider about your test results, treatment options, and if necessary, the need for more tests. ?Follow these instructions at home: ?Eating and drinking ? ?Eat a diet that includes fresh fruits and vegetables, whole grains, lean protein, and low-fat dairy products. ?Take vitamin and mineral supplements as recommended by your health care provider. ?Do not drink alcohol if: ?Your health care provider tells you not to drink. ?You are pregnant, may be pregnant, or are planning to become pregnant. ?If you drink alcohol: ?Limit how much you have to 0-1 drink a day. ?Know how much alcohol is in your drink. In the [place] one drink equals one 12 oz bottle of beer (355 mL), one 5 oz glass of wine (148 mL), or one 1? oz glass of hard liquor (44 mL). ?Lifestyle ?Brush your teeth every morning and night with fluoride toothpaste. Floss one time each day. ?Exercise for at least 30 minutes 5 or more days each week. ?Do not use any products that contain nicotine or tobacco. These products include cigarettes, chewing tobacco, and vaping devices, such as e-cigarettes. If you need help   quitting, ask your health care provider. ?Do not use drugs. ?If you are sexually active, practice safe sex. Use a condom or other form of protection to prevent STIs. ?If you  do not wish to become pregnant, use a form of birth control. If you plan to become pregnant, see your health care provider for a prepregnancy visit. ?Take aspirin only as told by your health care provider. Make sure that you understand how much to take and what form to take. Work with your health care provider to find out whether it is safe and beneficial for you to take aspirin daily. ?Find healthy ways to manage stress, such as: ?Meditation, yoga, or listening to music. ?Journaling. ?Talking to a trusted person. ?Spending time with friends and family. ?Minimize exposure to UV radiation to reduce your risk of skin cancer. ?Safety ?Always wear your seat belt while driving or riding in a vehicle. ?Do not drive: ?If you have been drinking alcohol. Do not ride with someone who has been drinking. ?When you are tired or distracted. ?While texting. ?If you have been using any mind-altering substances or drugs. ?Wear a helmet and other protective equipment during sports activities. ?If you have firearms in your house, make sure you follow all gun safety procedures. ?Seek help if you have been physically or sexually abused. ?What's next? ?Visit your health care provider once a year for an annual wellness visit. ?Ask your health care provider how often you should have your eyes and teeth checked. ?Stay up to date on all vaccines. ?This information is not intended to replace advice given to you by your health care provider. Make sure you discuss any questions you have with your health care provider. ?Document Revised: 02/13/2021 Document Reviewed: 02/13/2021 ?Elsevier Patient Education ? 2022 Elsevier Inc. ? ?

## 2021-10-16 NOTE — Assessment & Plan Note (Signed)
Commended her on weight loss.  Continue Topamax 50 mg PRN. No recent use.

## 2021-10-16 NOTE — Progress Notes (Signed)
Subjective:    Patient ID: Kelly Sparks, female    DOB: 11-Jul-1976, 46 y.o.   MRN: [id number]  HPI  Kelly Sparks is a very pleasant 46 y.o. female who presents today for complete physical and follow up of chronic conditions.  Immunizations: -Tetanus: Due today -Influenza: Completed this season  -Covid-19: 2 vaccines   Diet: Fair diet.  Exercise: Five days weekly in the morning   Eye exam: Completes annually  Dental exam: Completes semi-annually   Pap Smear: Completed in 2021 Mammogram: Completed in January 2023 Colonoscopy: Completed in 2021, due 2026  BP Readings from Last 3 Encounters:  10/16/21 118/76  05/01/21 126/86  04/03/21 117/88    Wt Readings from Last 3 Encounters:  10/16/21 175 lb (79.4 kg)  05/01/21 180 lb (81.6 kg)  04/03/21 178 lb 4.8 oz (80.9 kg)        Review of Systems  Constitutional:  Negative for unexpected weight change.  HENT:  Negative for rhinorrhea.   Respiratory:  Negative for cough and shortness of breath.   Cardiovascular:  Negative for chest pain.  Gastrointestinal:  Negative for constipation and diarrhea.  Genitourinary:  Negative for difficulty urinating.  Musculoskeletal:  Negative for arthralgias and myalgias.  Skin:  Negative for rash.  Allergic/Immunologic: Negative for environmental allergies.  Neurological:  Negative for dizziness and headaches.  Psychiatric/Behavioral:  The patient is not nervous/anxious.         Past Medical History:  Diagnosis Date   Acid reflux    Alopecia    BRCA negative 05/2017   BRCA/MyRisk except POLE VUS   Family history of breast cancer 05/2017   MyRisk neg; IBIS=14%   Hemorrhoids    Rectal bleeding 02/10/2013    Social History   Socioeconomic History   Marital status: Married    Spouse name: Not on file   Number of children: Not on file   Years of education: Not on file   Highest education level: Not on file  Occupational History   Not on file  Tobacco Use    Smoking status: Never   Smokeless tobacco: Never  Vaping Use   Vaping Use: Never used  Substance and Sexual Activity   Alcohol use: Yes    Alcohol/week: 1.0 standard drink    Types: 1 Standard drinks or equivalent per week    Comment: occasional - 1 mixed drink/mo   Drug use: No   Sexual activity: Yes    Birth control/protection: Surgical    Comment: vasectomy  Other Topics Concern   Not on file  Social History Narrative   Not on file   Social Determinants of Health   Financial Resource Strain: Not on file  Food Insecurity: Not on file  Transportation Needs: Not on file  Physical Activity: Not on file  Stress: Not on file  Social Connections: Not on file  Intimate Partner Violence: Not on file    Past Surgical History:  Procedure Laterality Date   BIOPSY N/A 04/09/2020   Procedure: BIOPSY;  Surgeon: Virgel Manifold, MD;  Location: [place];  Service: Endoscopy;  Laterality: N/A;   COLONOSCOPY WITH PROPOFOL N/A 04/09/2020   Procedure: COLONOSCOPY WITH PROPOFOL;  Surgeon: Virgel Manifold, MD;  Location: [place];  Service: Endoscopy;  Laterality: N/A;   NO PAST SURGERIES     TONSILLECTOMY Bilateral 10/17/2015   Procedure: TONSILLECTOMY;  Surgeon: Carloyn Manner, MD;  Location: [place];  Service: ENT;  Laterality: Bilateral;  Family History  Problem Relation Age of Onset   Breast cancer Maternal Aunt 55   Uterine cancer Maternal Aunt    Hyperlipidemia Father    Hypertension Father    Lung cancer Father    Hyperlipidemia Mother    Hypertension Mother    Diabetes Maternal Uncle    Breast cancer Paternal Aunt 46    No Known Allergies  Current Outpatient Medications on File Prior to Visit  Medication Sig Dispense Refill   cholecalciferol (VITAMIN D3) 25 MCG (1000 UNIT) tablet      famotidine (PEPCID) 20 MG tablet Take 1 tablet (20 mg total) by mouth 2 (two) times daily. As needed for heartburn. 180 tablet 0    hydrocortisone (ANUSOL-HC) 25 MG suppository Place 1 suppository (25 mg total) rectally 2 (two) times daily. 12 suppository 0   levonorgestrel (KYLEENA) 19.5 MG IUD by Intrauterine route.     Multiple Vitamin (MULTIVITAMIN) tablet Take 1 tablet by mouth daily.     Omega-3 Fatty Acids (FISH OIL) 1000 MG CAPS      polyethylene glycol powder (GLYCOLAX/MIRALAX) powder Take 17 g by mouth daily as needed.     Probiotic Product (PROBIOTIC-10) CAPS Take 1 tablet by mouth daily.     sertraline (ZOLOFT) 50 MG tablet Take 1 tablet (50 mg total) by mouth daily. For anxiety. Office visit required for further refills. 60 tablet 0   topiramate (TOPAMAX) 50 MG tablet Take 1 tablet (50 mg total) by mouth daily. 90 tablet 0   valACYclovir (VALTREX) 1000 MG tablet Take 2 tablets twice daily for one day at cold sore onset. 12 tablet 0   Zinc 100 MG TABS      No current facility-administered medications on file prior to visit.    BP 118/76    Pulse 81    Temp 98.6 F (37 C) (Oral)    Ht 5' 4"  (1.626 m)    Wt 175 lb (79.4 kg)    SpO2 98%    BMI 30.04 kg/m  Objective:   Physical Exam HENT:     Right Ear: Tympanic membrane and ear canal normal.     Left Ear: Tympanic membrane and ear canal normal.     Nose: Nose normal.  Eyes:     Conjunctiva/sclera: Conjunctivae normal.     Pupils: Pupils are equal, round, and reactive to light.  Neck:     Thyroid: No thyromegaly.  Cardiovascular:     Rate and Rhythm: Normal rate and regular rhythm.     Heart sounds: No murmur heard. Pulmonary:     Effort: Pulmonary effort is normal.     Breath sounds: Normal breath sounds. No rales.  Abdominal:     General: Bowel sounds are normal.     Palpations: Abdomen is soft.     Tenderness: There is no abdominal tenderness.  Musculoskeletal:        General: Normal range of motion.     Cervical back: Neck supple.  Lymphadenopathy:     Cervical: No cervical adenopathy.  Skin:    General: Skin is warm and dry.     Findings:  No rash.  Neurological:     Mental Status: She is alert and oriented to person, place, and time.     Cranial Nerves: No cranial nerve deficit.     Deep Tendon Reflexes: Reflexes are normal and symmetric.  Psychiatric:        Mood and Affect: Mood normal.  Assessment & Plan:      This visit occurred during the SARS-CoV-2 public health emergency.  Safety protocols were in place, including screening questions prior to the visit, additional usage of staff PPE, and extensive cleaning of exam room while observing appropriate contact time as indicated for disinfecting solutions.

## 2021-10-28 ENCOUNTER — Other Ambulatory Visit: Payer: Self-pay | Admitting: Primary Care

## 2021-10-28 ENCOUNTER — Other Ambulatory Visit: Payer: Self-pay

## 2021-10-28 DIAGNOSIS — F411 Generalized anxiety disorder: Secondary | ICD-10-CM

## 2021-10-28 DIAGNOSIS — B001 Herpesviral vesicular dermatitis: Secondary | ICD-10-CM

## 2021-10-28 MED ORDER — SERTRALINE HCL 50 MG PO TABS
50.0000 mg | ORAL_TABLET | Freq: Every day | ORAL | 3 refills | Status: DC
  Filled 2021-10-28: qty 90, 90d supply, fill #0
  Filled 2022-01-30: qty 90, 90d supply, fill #1
  Filled 2022-04-26: qty 90, 90d supply, fill #2
  Filled 2022-08-15: qty 90, 90d supply, fill #3

## 2021-10-28 MED ORDER — VALACYCLOVIR HCL 1 G PO TABS
ORAL_TABLET | ORAL | 0 refills | Status: DC
  Filled 2021-10-28: qty 12, 3d supply, fill #0

## 2021-10-28 NOTE — Telephone Encounter (Signed)
Last OV/CPE 10-16-21 Next Appt With Family Medicine Doreene Nest, NP) 10/22/2022 at 8:40 AM

## 2021-10-29 ENCOUNTER — Other Ambulatory Visit: Payer: Self-pay

## 2022-01-30 ENCOUNTER — Other Ambulatory Visit: Payer: Self-pay

## 2022-04-26 ENCOUNTER — Other Ambulatory Visit: Payer: Self-pay | Admitting: Primary Care

## 2022-04-26 DIAGNOSIS — E669 Obesity, unspecified: Secondary | ICD-10-CM

## 2022-04-27 ENCOUNTER — Other Ambulatory Visit: Payer: Self-pay

## 2022-04-27 ENCOUNTER — Other Ambulatory Visit: Payer: Self-pay | Admitting: Primary Care

## 2022-04-27 DIAGNOSIS — E669 Obesity, unspecified: Secondary | ICD-10-CM

## 2022-04-27 NOTE — Telephone Encounter (Signed)
Please call patient:  We received a refill request for her Topamax medication.  We have not refilled this medication in a while, is she resuming?

## 2022-04-28 ENCOUNTER — Other Ambulatory Visit: Payer: Self-pay

## 2022-04-29 ENCOUNTER — Other Ambulatory Visit: Payer: Self-pay

## 2022-04-29 NOTE — Telephone Encounter (Signed)
Please see my comments from 04/26/2022 regarding the Topamax refill.

## 2022-04-30 NOTE — Telephone Encounter (Signed)
Spoke to patient by telephone and was advised that she is not taking Topamax. Patient stated that she did  not request the refill.

## 2022-05-01 ENCOUNTER — Other Ambulatory Visit: Payer: Self-pay

## 2022-05-09 ENCOUNTER — Telehealth: Payer: No Typology Code available for payment source | Admitting: Physician Assistant

## 2022-05-09 DIAGNOSIS — J019 Acute sinusitis, unspecified: Secondary | ICD-10-CM

## 2022-05-09 DIAGNOSIS — B9689 Other specified bacterial agents as the cause of diseases classified elsewhere: Secondary | ICD-10-CM

## 2022-05-09 MED ORDER — AMOXICILLIN-POT CLAVULANATE 875-125 MG PO TABS: 1.0000 | ORAL_TABLET | Freq: Two times a day (BID) | ORAL | 0 refills | Status: DC

## 2022-05-09 NOTE — Progress Notes (Signed)

## 2022-05-29 ENCOUNTER — Ambulatory Visit (INDEPENDENT_AMBULATORY_CARE_PROVIDER_SITE_OTHER): Payer: No Typology Code available for payment source | Admitting: Primary Care

## 2022-05-29 ENCOUNTER — Encounter: Payer: Self-pay | Admitting: Primary Care

## 2022-05-29 VITALS — BP 116/80 | HR 66 | Temp 98.8°F | Ht 64.0 in | Wt 190.0 lb

## 2022-05-29 DIAGNOSIS — Z23 Encounter for immunization: Secondary | ICD-10-CM | POA: Diagnosis not present

## 2022-05-29 DIAGNOSIS — Z111 Encounter for screening for respiratory tuberculosis: Secondary | ICD-10-CM | POA: Diagnosis not present

## 2022-05-29 DIAGNOSIS — M79604 Pain in right leg: Secondary | ICD-10-CM

## 2022-05-29 DIAGNOSIS — M79606 Pain in leg, unspecified: Secondary | ICD-10-CM

## 2022-05-29 DIAGNOSIS — M79605 Pain in left leg: Secondary | ICD-10-CM | POA: Diagnosis not present

## 2022-05-29 HISTORY — DX: Pain in leg, unspecified: M79.606

## 2022-05-29 NOTE — Progress Notes (Signed)
Subjective:    Patient ID: Kelly Sparks, female    DOB: [birth year], 46 y.o.   MRN: [id number]  Leg Pain  Pertinent negatives include no numbness.    Kelly Sparks is a very pleasant 46 y.o. female with a history of GERD, GAD, elevated LFT's, prediabetes who presents today requesting influenza vaccine and TB testing, and here to discuss lower extremity pain.  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    Her pain is located to the bilateral lower extemities, but mostly to the right posterior lower extremity from the posterior knee down through the calf. Her pain is intermittent for which began >10 years ago but her pain has progressed over the last 1 year. She's been sitting more than usual as she is studying for nursing school. Her pain is improved by elevating her leg, finds herself stretching and elevating her legs during class due to her discomfort.   She denies back pain, hip pain, swelling/redness/warmth to the calves, trauma/injury. She does notice bilateral anterior knee discomfort at times, this isn't bothersome. She doesn't take anything for her symptoms as her symptoms don't bother her to that extent and stretching helps.   She has been exercising three times weekly, walks on the treadmill three days weekly at an incline of 1 for about 30 minutes.      Review of Systems  Musculoskeletal:  Negative for back pain and joint swelling.       See HPI  Skin:  Negative for color change.  Neurological:  Negative for weakness and numbness.         Past Medical History:  Diagnosis Date   Acid reflux    Alopecia    BRCA negative 05/2017   BRCA/MyRisk except POLE VUS   DUB (dysfunctional uterine bleeding) 04/29/2017   Family history of breast cancer 05/2017   MyRisk neg; IBIS=14%   Hemorrhoids    Rectal bleeding 02/10/2013    Social History   Socioeconomic History   Marital status: Married    Spouse name: Not on file    Number of children: Not on file   Years of education: Not on file   Highest education level: Not on file  Occupational History   Not on file  Tobacco Use   Smoking status: Never   Smokeless tobacco: Never  Vaping Use   Vaping Use: Never used  Substance and Sexual Activity   Alcohol use: Yes    Alcohol/week: 1.0 standard drink of alcohol    Types: 1 Standard drinks or equivalent per week    Comment: occasional - 1 mixed drink/mo   Drug use: No   Sexual activity: Yes    Birth control/protection: Surgical    Comment: vasectomy  Other Topics Concern   Not on file  Social History Narrative   Not on file   Social Determinants of Health   Financial Resource Strain: Not on file  Food Insecurity: Not on file  Transportation Needs: Not on file  Physical Activity: Not on file  Stress: Not on file  Social Connections: Not on file  Intimate Partner Violence: Not on file    Past Surgical History:  Procedure Laterality Date   BIOPSY N/A 04/09/2020   Procedure: BIOPSY;  Surgeon: Virgel Manifold, MD;  Location: [place];  Service: Endoscopy;  Laterality: N/A;   COLONOSCOPY WITH PROPOFOL N/A 04/09/2020   Procedure: COLONOSCOPY WITH PROPOFOL;  Surgeon: Vonda Antigua  B, MD;  Location: [place];  Service: Endoscopy;  Laterality: N/A;   NO PAST SURGERIES     TONSILLECTOMY Bilateral 10/17/2015   Procedure: TONSILLECTOMY;  Surgeon: Carloyn Manner, MD;  Location: [facility];  Service: ENT;  Laterality: Bilateral;    Family History  Problem Relation Age of Onset   Breast cancer Maternal Aunt 58   Uterine cancer Maternal Aunt    Hyperlipidemia Father    Hypertension Father    Lung cancer Father    Hyperlipidemia Mother    Hypertension Mother    Diabetes Maternal Uncle    Breast cancer Paternal Aunt 80    No Known Allergies  Current Outpatient Medications on File Prior to Visit  Medication Sig Dispense Refill   cholecalciferol (VITAMIN D3) 25  MCG (1000 UNIT) tablet      famotidine (PEPCID) 20 MG tablet Take 1 tablet (20 mg total) by mouth 2 (two) times daily. As needed for heartburn. 180 tablet 0   hydrocortisone (ANUSOL-HC) 25 MG suppository Place 1 suppository (25 mg total) rectally 2 (two) times daily. 12 suppository 0   levonorgestrel (KYLEENA) 19.5 MG IUD by Intrauterine route.     Multiple Vitamin (MULTIVITAMIN) tablet Take 1 tablet by mouth daily.     Omega-3 Fatty Acids (FISH OIL) 1000 MG CAPS      polyethylene glycol powder (GLYCOLAX/MIRALAX) powder Take 17 g by mouth daily as needed.     Probiotic Product (PROBIOTIC-10) CAPS Take 1 tablet by mouth daily.     sertraline (ZOLOFT) 50 MG tablet Take 1 tablet (50 mg total) by mouth daily. For anxiety 90 tablet 3   topiramate (TOPAMAX) 50 MG tablet Take 1 tablet (50 mg total) by mouth daily. (Patient not taking: Reported on 05/29/2022) 90 tablet 0   valACYclovir (VALTREX) 1000 MG tablet Take 2 tablets twice daily for one day at cold sore onset. 12 tablet 0   Zinc 100 MG TABS      No current facility-administered medications on file prior to visit.    BP 116/80   Pulse 66   Temp 98.8 F (37.1 C) (Temporal)   Ht 5' 4"  (1.626 m)   Wt 190 lb (86.2 kg)   SpO2 99%   BMI 32.61 kg/m  Objective:   Physical Exam Constitutional:      General: She is not in acute distress. Musculoskeletal:       Legs:     Comments: No obvious mass noted to right or left popliteal fossas. 5/5 strength to bilateral lower extremities. No calf tenderness.   Skin:    General: Skin is warm and dry.     Findings: No erythema.           Assessment & Plan:   Problem List Items Addressed This Visit       Other   Lower extremity pain    Right greater than left. Unclear etiology, but do suspect inflammatory cause or perhaps nonpalpable bakers cyst. Fortunately, symptoms are improved with stretching and she experiences no symptons with walking.  Increase walking, try to get up from  studying every 1 hour, increase stretching. Offered PT for which she kindly declines. Discussed use of Voltaren Gel.  Follow up if symptoms progress.       Other Visit Diagnoses     Screening for tuberculosis    -  Primary   Relevant Orders   QuantiFERON-TB Gold Plus          Pleas Koch, NP

## 2022-05-29 NOTE — Patient Instructions (Signed)
Stop by the lab prior to leaving today. I will notify you of your results once received.   Increase stretching, walking, and take more frequent breaks from studying. Let me know if your symptoms get worse.  It was a pleasure to see you today!

## 2022-05-29 NOTE — Assessment & Plan Note (Signed)
Right greater than left. Unclear etiology, but do suspect inflammatory cause or perhaps nonpalpable bakers cyst. Fortunately, symptoms are improved with stretching and she experiences no symptons with walking.  Increase walking, try to get up from studying every 1 hour, increase stretching. Offered PT for which she kindly declines. Discussed use of Voltaren Gel.  Follow up if symptoms progress.

## 2022-06-02 LAB — QUANTIFERON-TB GOLD PLUS
Mitogen-NIL: >10 IU/mL
NIL: 0.04 IU/mL
QuantiFERON-TB Gold Plus: NEGATIVE
TB1-NIL: 0.01 IU/mL
TB2-NIL: 0.01 IU/mL

## 2022-07-21 ENCOUNTER — Encounter: Payer: Self-pay | Admitting: Certified Nurse Midwife

## 2022-07-21 ENCOUNTER — Ambulatory Visit (INDEPENDENT_AMBULATORY_CARE_PROVIDER_SITE_OTHER): Payer: No Typology Code available for payment source | Admitting: Certified Nurse Midwife

## 2022-07-21 VITALS — BP 124/85 | HR 66 | Ht 64.0 in | Wt 190.4 lb

## 2022-07-21 DIAGNOSIS — Z30432 Encounter for removal of intrauterine contraceptive device: Secondary | ICD-10-CM | POA: Diagnosis not present

## 2022-07-21 NOTE — Progress Notes (Signed)
    GYNECOLOGY OFFICE PROCEDURE NOTE  [name] is a 46 y.o. [id number] here for kyleena IUD removal. No GYN concerns.  Last pap smear was on 08/08/2020 and was normal.  IUD Removal  Patient identified, informed consent performed, consent signed.  Patient was placed in the dorsal lithotomy position, normal external genitalia was noted.  A speculum was placed in the patient's vagina, normal discharge was noted, no lesions. The cervix was visualized, no lesions, no abnormal discharge.  The strings of the IUD were grasped and pulled using ring forceps. The IUD was removed in its entirety. Patient tolerated the procedure well.    Patient spouse had a vasectomy.  Routine preventative health maintenance measures emphasized.   Doreene Burke, CNM

## 2022-07-28 ENCOUNTER — Encounter: Payer: Self-pay | Admitting: Certified Nurse Midwife

## 2022-08-08 IMAGING — MG DIGITAL DIAGNOSTIC BILAT W/ TOMO W/ CAD
8 of 11 series · 8 of 27 positions shown · non-contrast
Comparison: Previous exam(s).

CLINICAL DATA: Short-term follow-up for 2 small groups of
calcifications in the right breast, initially evaluated on
09/06/2019, with follow-up imaging performed on 03/07/2020. No
current breast complaints.

EXAM:
DIGITAL DIAGNOSTIC BILATERAL MAMMOGRAM WITH TOMO AND CAD

[R CC]
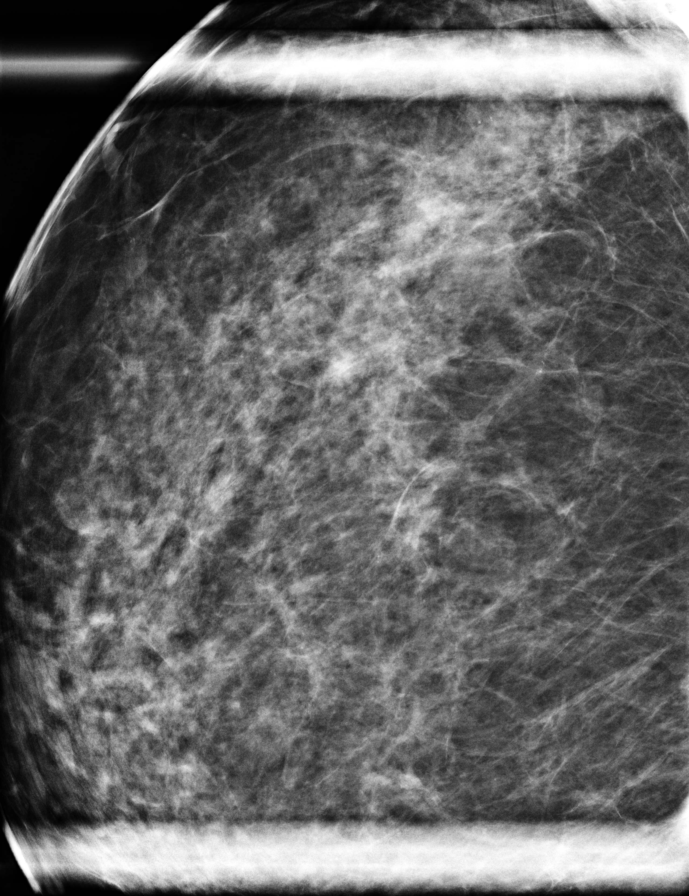

[R ML (1 of 2)]
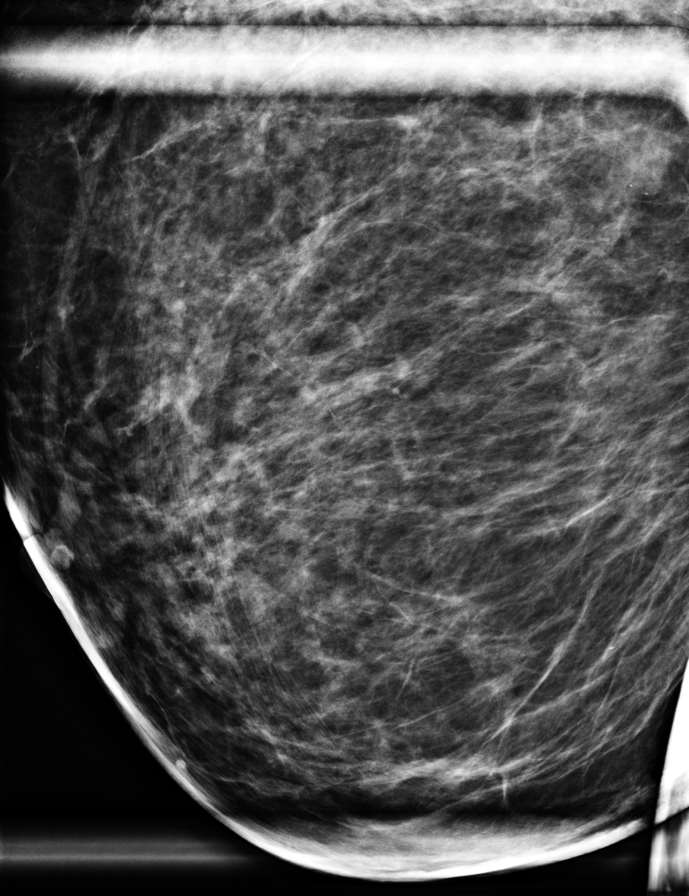

[R ML (2 of 2)]
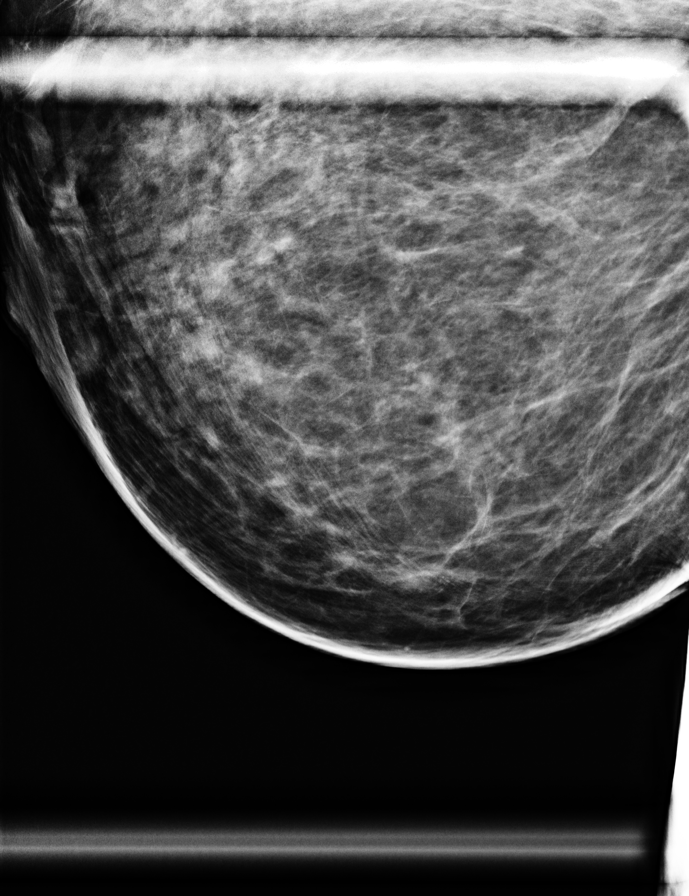

[R MLO synth-2D]
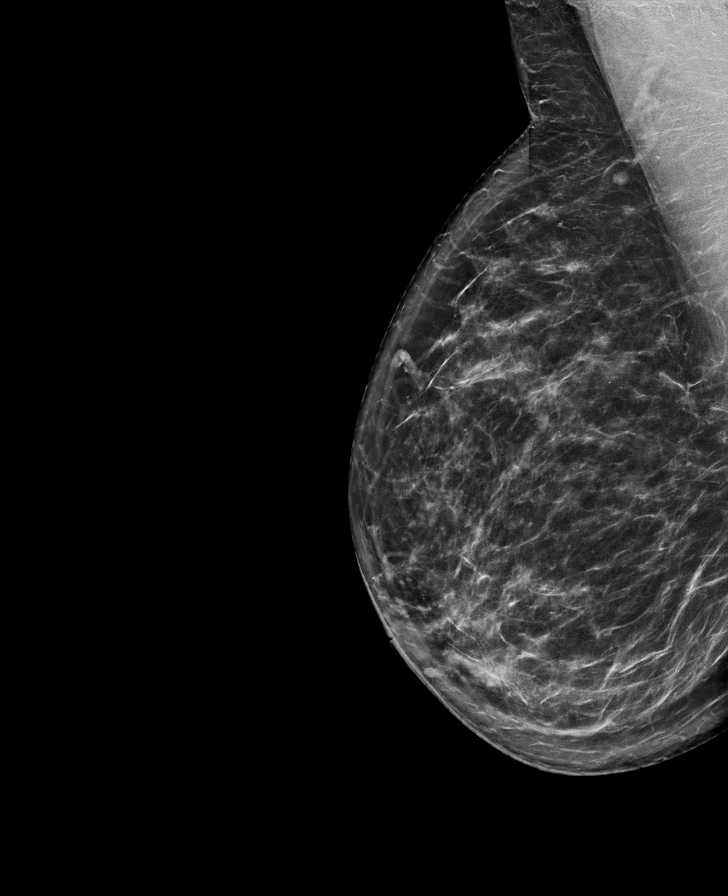

[L MLO synth-2D]
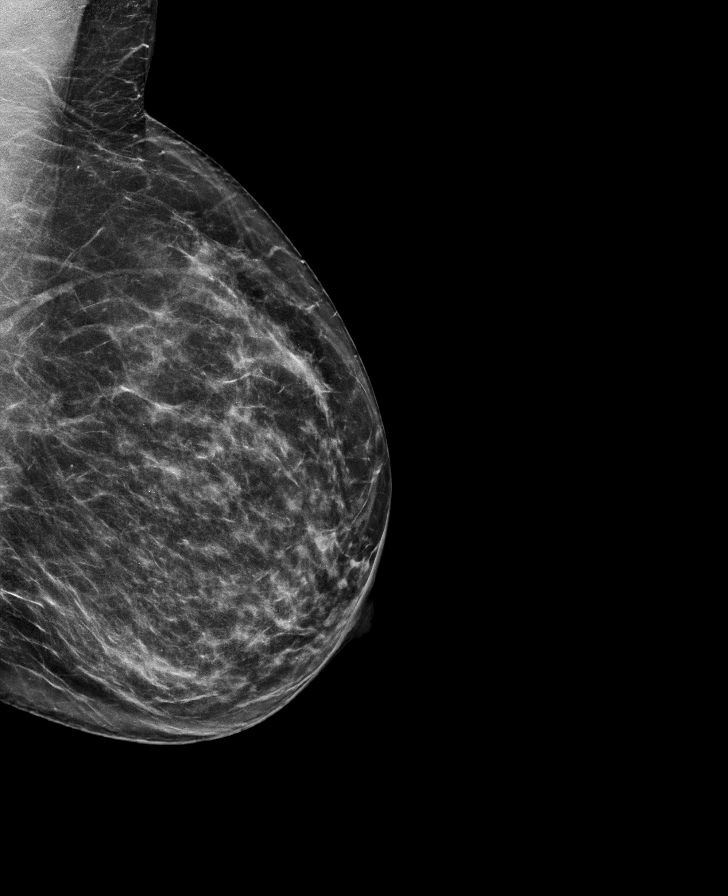

[R CC synth-2D]
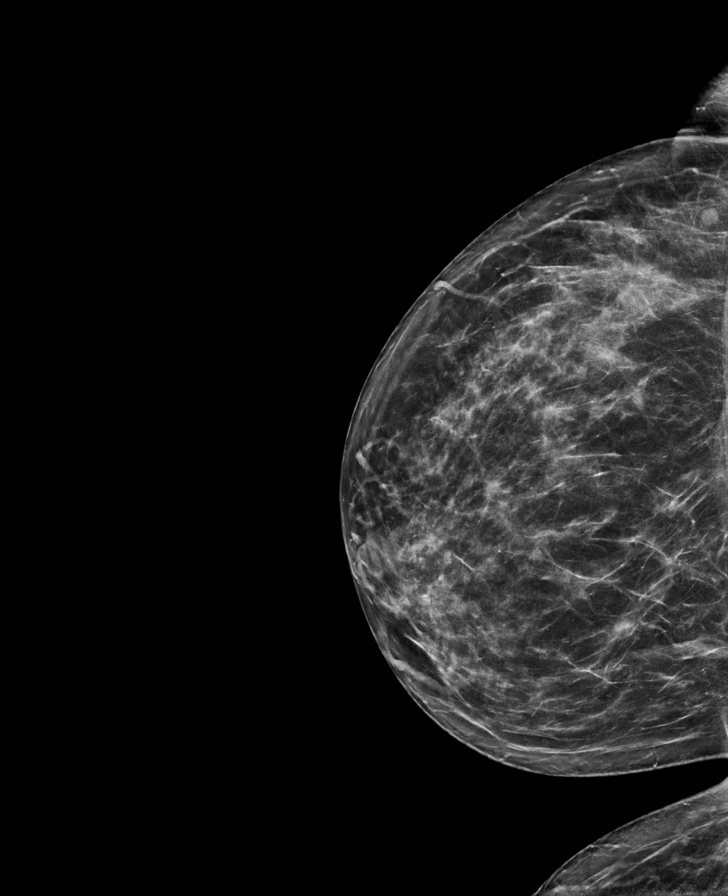

[L CC synth-2D]
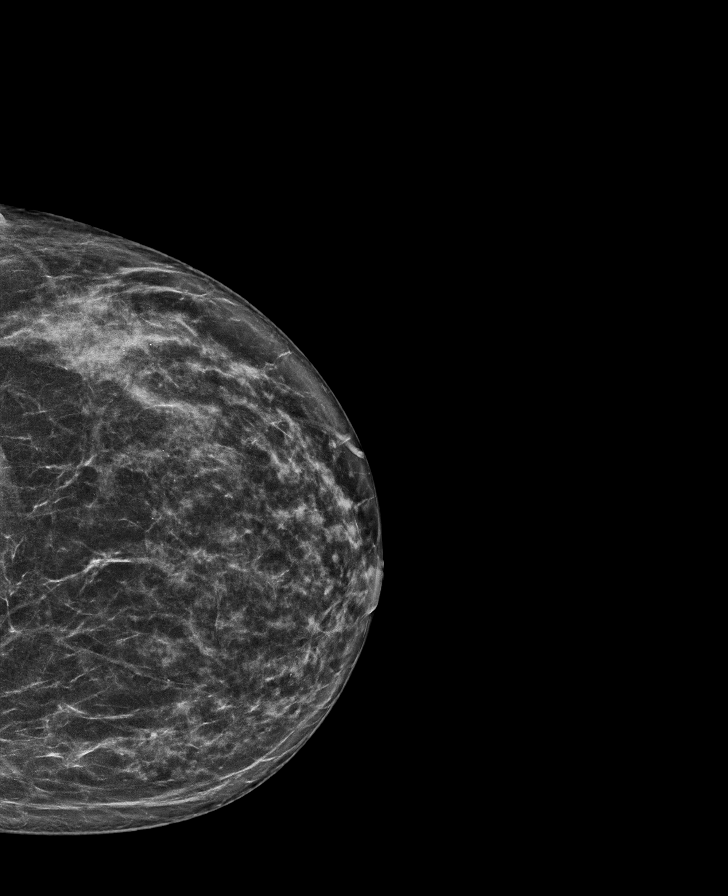

[R MLO tomo · tomo slice 45/90.0]
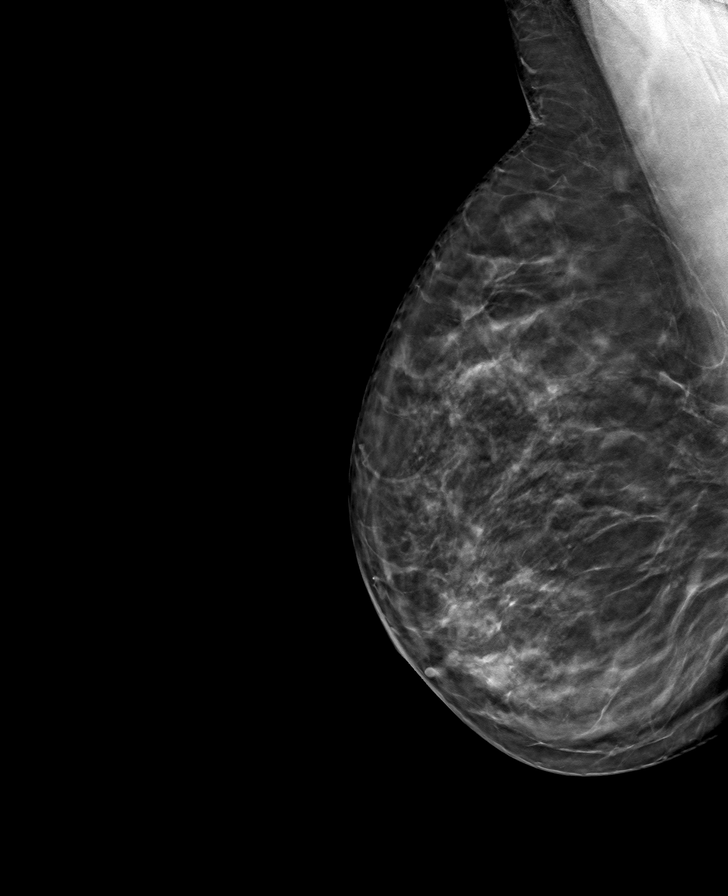

[8 of 27 positions shown; findings below may reference images not displayed]

ACR Breast Density Category c: The breast tissue is heterogeneously
dense, which may obscure small masses.
FINDINGS: The 2 small groups of calcifications in the right breast are
unchanged.

There are no suspicious masses, areas of architectural distortion,
areas of significant asymmetry or new calcifications.

Mammographic images were processed with CAD.
IMPRESSION: 1. 2 small groups of probably benign calcifications in the right
breast, stable for 1 year. Additional follow-up diagnostic imaging
in 1 year, to provide 2 years of stability, is recommended.

RECOMMENDATION:
Diagnostic mammography with right breast magnification views in 1
year.

I have discussed the findings and recommendations with the patient.
If applicable, a reminder letter will be sent to the patient
regarding the next appointment.

BI-RADS CATEGORY  3: Probably benign.

## 2022-08-13 ENCOUNTER — Encounter: Payer: Self-pay | Admitting: Primary Care

## 2022-08-13 ENCOUNTER — Ambulatory Visit (INDEPENDENT_AMBULATORY_CARE_PROVIDER_SITE_OTHER): Payer: No Typology Code available for payment source | Admitting: Primary Care

## 2022-08-13 ENCOUNTER — Other Ambulatory Visit: Payer: Self-pay

## 2022-08-13 VITALS — BP 130/82 | HR 78 | Temp 98.8°F | Ht 64.0 in | Wt 192.0 lb

## 2022-08-13 DIAGNOSIS — H5712 Ocular pain, left eye: Secondary | ICD-10-CM | POA: Diagnosis not present

## 2022-08-13 MED ORDER — POLYMYXIN B-TRIMETHOPRIM 10000-0.1 UNIT/ML-% OP SOLN
1.0000 [drp] | Freq: Four times a day (QID) | OPHTHALMIC | 0 refills | Status: DC
  Filled 2022-08-13: qty 10, 5d supply, fill #0

## 2022-08-13 NOTE — Assessment & Plan Note (Signed)
Symptoms and exam represent dry eyes mostly.  There was a slight illumination of staining to the bottom left eye from slit-lamp evaluation today.  Treat with trimethoprim-polymyxin B drops every 6 hours x 5 to 7 days. Recommended she set an appointment up with her optometrist.  Consider something like Restasis if needed. Consider further workup for Sjogren's if warranted.

## 2022-08-13 NOTE — Progress Notes (Signed)
Subjective:    Patient ID: Kelly Sparks, female    DOB: 1976-01-13, 46 y.o.   MRN: [id number]  HPI  Kelly Sparks is a very pleasant 46 y.o. female with a history of prediabetes, GAD, proctitis who presents today to discuss eye burning.   Her symptoms are located to the left eye which she describes as a constant burning sensation, and a sensation that something is in her eye. Symptoms are worse when looking down at her school notes or computer screen, staring at something, or when her eye waters.   Her eyes have been dry for about 1 year, worse over the last several weeks to left eye. She doesn't feel that she scratched her sclera. She denies drainage, redness, or swelling to her eyes. She does notice slight dryness to her right eye, but no burning sensation. She spends most of her day reading or looking at a computer.   Two weeks ago she experienced a sudden, sharp pain to her left eye that lasted seconds. She has noticed intermittent sinus pressure, has been using Flonase and taking an antihistamine.   She does not wear contacts. She does have glasses, does not wear them often. She has been using some rewetting drops once weekly which causes burning briefly.    Review of Systems  HENT:  Positive for sinus pressure.   Eyes:  Positive for pain and visual disturbance. Negative for discharge, redness and itching.  Neurological:  Negative for headaches.         Past Medical History:  Diagnosis Date   Acid reflux    Alopecia    BRCA negative 05/2017   BRCA/MyRisk except POLE VUS   DUB (dysfunctional uterine bleeding) 04/29/2017   Family history of breast cancer 05/2017   MyRisk neg; IBIS=14%   Hemorrhoids    Rectal bleeding 02/10/2013    Social History   Socioeconomic History   Marital status: Married    Spouse name: Not on file   Number of children: Not on file   Years of education: Not on file   Highest education level: Not on file  Occupational History   Not on  file  Tobacco Use   Smoking status: Never   Smokeless tobacco: Never  Vaping Use   Vaping Use: Never used  Substance and Sexual Activity   Alcohol use: Yes    Alcohol/week: 1.0 standard drink of alcohol    Types: 1 Standard drinks or equivalent per week    Comment: occasional - 1 mixed drink/mo   Drug use: No   Sexual activity: Yes    Birth control/protection: Surgical    Comment: vasectomy  Other Topics Concern   Not on file  Social History Narrative   Not on file   Social Determinants of Health   Financial Resource Strain: Not on file  Food Insecurity: Not on file  Transportation Needs: Not on file  Physical Activity: Not on file  Stress: Not on file  Social Connections: Not on file  Intimate Partner Violence: Not on file    Past Surgical History:  Procedure Laterality Date   BIOPSY N/A 04/09/2020   Procedure: BIOPSY;  Surgeon: Virgel Manifold, MD;  Location: [facility];  Service: Endoscopy;  Laterality: N/A;   COLONOSCOPY WITH PROPOFOL N/A 04/09/2020   Procedure: COLONOSCOPY WITH PROPOFOL;  Surgeon: Virgel Manifold, MD;  Location: [place];  Service: Endoscopy;  Laterality: N/A;   NO PAST SURGERIES     TONSILLECTOMY Bilateral  10/17/2015   Procedure: TONSILLECTOMY;  Surgeon: Carloyn Manner, MD;  Location: [place];  Service: ENT;  Laterality: Bilateral;    Family History  Problem Relation Age of Onset   Breast cancer Maternal Aunt 70   Uterine cancer Maternal Aunt    Hyperlipidemia Father    Hypertension Father    Lung cancer Father    Hyperlipidemia Mother    Hypertension Mother    Diabetes Maternal Uncle    Breast cancer Paternal Aunt 4    No Known Allergies  Current Outpatient Medications on File Prior to Visit  Medication Sig Dispense Refill   cholecalciferol (VITAMIN D3) 25 MCG (1000 UNIT) tablet      famotidine (PEPCID) 20 MG tablet Take 1 tablet (20 mg total) by mouth 2 (two) times daily. As needed for  heartburn. 180 tablet 0   hydrocortisone (ANUSOL-HC) 25 MG suppository Place 1 suppository (25 mg total) rectally 2 (two) times daily. 12 suppository 0   Multiple Vitamin (MULTIVITAMIN) tablet Take 1 tablet by mouth daily.     Omega-3 Fatty Acids (FISH OIL) 1000 MG CAPS      polyethylene glycol powder (GLYCOLAX/MIRALAX) powder Take 17 g by mouth daily as needed.     Probiotic Product (PROBIOTIC-10) CAPS Take 1 tablet by mouth daily.     sertraline (ZOLOFT) 50 MG tablet Take 1 tablet (50 mg total) by mouth daily. For anxiety 90 tablet 3   valACYclovir (VALTREX) 1000 MG tablet Take 2 tablets twice daily for one day at cold sore onset. 12 tablet 0   Zinc 100 MG TABS      levonorgestrel (KYLEENA) 19.5 MG IUD by Intrauterine route. (Patient not taking: Reported on 08/13/2022)     topiramate (TOPAMAX) 50 MG tablet Take 1 tablet (50 mg total) by mouth daily. (Patient not taking: Reported on 08/13/2022) 90 tablet 0   No current facility-administered medications on file prior to visit.    BP 130/82   Pulse 78   Temp 98.8 F (37.1 C) (Temporal)   Ht _0  (1.626 m)   Wt 192 lb (87.1 kg)   SpO2 98%   BMI 32.96 kg/m  Objective:   Physical Exam Eyes:     General: Lids are normal. No visual field deficit.       Left eye: No foreign body, discharge or hordeolum.     Extraocular Movements:     Left eye: Normal extraocular motion and no nystagmus.     Conjunctiva/sclera:     Left eye: Left conjunctiva is not injected. No exudate or hemorrhage.    Comments: Slit-lamp evaluation performed today.  Slight illumination of a spot to left mid eye.           Assessment & Plan:   Problem List Items Addressed This Visit       Other   Eye pain, left - Primary    Symptoms and exam represent dry eyes mostly.  There was a slight illumination of staining to the bottom left eye from slit-lamp evaluation today.  Treat with trimethoprim-polymyxin B drops every 6 hours x 5 to 7 days. Recommended she  set an appointment up with her optometrist.  Consider something like Restasis if needed. Consider further workup for Sjogren's if warranted.      Relevant Medications   trimethoprim-polymyxin b (POLYTRIM) ophthalmic solution       Pleas Koch, NP

## 2022-08-13 NOTE — Patient Instructions (Signed)
Try the antibiotic eyedrops as discussed.  Instill 1 drop into the left eye every 6 hours for 5 to 7 days.  Schedule an appointment with your optometrist.  It was a pleasure to see you today!

## 2022-08-15 ENCOUNTER — Other Ambulatory Visit: Payer: Self-pay

## 2022-08-15 ENCOUNTER — Other Ambulatory Visit: Payer: Self-pay | Admitting: Primary Care

## 2022-08-15 DIAGNOSIS — B001 Herpesviral vesicular dermatitis: Secondary | ICD-10-CM

## 2022-08-15 MED FILL — Valacyclovir HCl Tab 1 GM: ORAL | 3 days supply | Qty: 12 | Fill #0 | Status: AC

## 2022-08-17 ENCOUNTER — Other Ambulatory Visit: Payer: Self-pay

## 2022-10-22 ENCOUNTER — Other Ambulatory Visit: Payer: Self-pay | Admitting: Primary Care

## 2022-10-22 ENCOUNTER — Ambulatory Visit (INDEPENDENT_AMBULATORY_CARE_PROVIDER_SITE_OTHER): Payer: 59 | Admitting: Primary Care

## 2022-10-22 ENCOUNTER — Other Ambulatory Visit: Payer: Self-pay

## 2022-10-22 ENCOUNTER — Encounter: Payer: Self-pay | Admitting: Primary Care

## 2022-10-22 VITALS — BP 136/84 | HR 54 | Temp 98.1°F | Ht 64.0 in | Wt 193.0 lb

## 2022-10-22 DIAGNOSIS — B001 Herpesviral vesicular dermatitis: Secondary | ICD-10-CM | POA: Diagnosis not present

## 2022-10-22 DIAGNOSIS — F411 Generalized anxiety disorder: Secondary | ICD-10-CM

## 2022-10-22 DIAGNOSIS — Z0000 Encounter for general adult medical examination without abnormal findings: Secondary | ICD-10-CM | POA: Diagnosis not present

## 2022-10-22 DIAGNOSIS — Z1231 Encounter for screening mammogram for malignant neoplasm of breast: Secondary | ICD-10-CM | POA: Diagnosis not present

## 2022-10-22 DIAGNOSIS — R7303 Prediabetes: Secondary | ICD-10-CM

## 2022-10-22 DIAGNOSIS — K649 Unspecified hemorrhoids: Secondary | ICD-10-CM | POA: Diagnosis not present

## 2022-10-22 LAB — COMPREHENSIVE METABOLIC PANEL
ALT: 16 U/L (ref 0–35)
AST: 18 U/L (ref 0–37)
Albumin: 4.3 g/dL (ref 3.5–5.2)
Alkaline Phosphatase: 64 U/L (ref 39–117)
BUN: 10 mg/dL (ref 6–23)
CO2: 27 mEq/L (ref 19–32)
Calcium: 9.2 mg/dL (ref 8.4–10.5)
Chloride: 104 mEq/L (ref 96–112)
Creatinine, Ser: 0.81 mg/dL (ref 0.40–1.20)
GFR: 86.91 mL/min (ref >60.00)
Glucose, Bld: 98 mg/dL (ref 70–99)
Potassium: 4.3 mEq/L (ref 3.5–5.1)
Sodium: 137 mEq/L (ref 135–145)
Total Bilirubin: 0.5 mg/dL (ref 0.2–1.2)
Total Protein: 7 g/dL (ref 6.0–8.3)

## 2022-10-22 LAB — CBC
HCT: 38.3 % (ref 36.0–46.0)
Hemoglobin: 12.8 g/dL (ref 12.0–15.0)
MCHC: 33.5 g/dL (ref 30.0–36.0)
MCV: 92.3 fl (ref 78.0–100.0)
Platelets: 285 10*3/uL (ref 150.0–400.0)
RBC: 4.15 Mil/uL (ref 3.87–5.11)
RDW: 13.3 % (ref 11.5–15.5)
WBC: 5.6 10*3/uL (ref 4.0–10.5)

## 2022-10-22 LAB — LIPID PANEL
Cholesterol: 184 mg/dL (ref 0–200)
HDL: 69 mg/dL (ref >39.00)
LDL Cholesterol: 105 mg/dL — ABNORMAL HIGH (ref 0–99)
NonHDL: 114.5
Total CHOL/HDL Ratio: 3
Triglycerides: 46 mg/dL (ref 0.0–149.0)
VLDL: 9.2 mg/dL (ref 0.0–40.0)

## 2022-10-22 LAB — HEMOGLOBIN A1C: Hgb A1c MFr Bld: 6 % (ref 4.6–6.5)

## 2022-10-22 MED ORDER — HYDROCORTISONE ACETATE 25 MG RE SUPP
25.0000 mg | Freq: Two times a day (BID) | RECTAL | 0 refills | Status: AC
  Filled 2022-10-22: qty 12, 6d supply, fill #0

## 2022-10-22 MED ORDER — SERTRALINE HCL 50 MG PO TABS
50.0000 mg | ORAL_TABLET | Freq: Every day | ORAL | 3 refills | Status: DC
  Filled 2022-10-22 – 2022-12-24 (×3): qty 90, 90d supply, fill #0
  Filled 2023-04-15: qty 90, 90d supply, fill #1
  Filled 2023-07-11: qty 90, 90d supply, fill #2
  Filled 2023-10-20: qty 90, 90d supply, fill #3

## 2022-10-22 NOTE — Assessment & Plan Note (Signed)
Immunizations UTD. Pap smear UTD. Mammogram due and ordered. Colonoscopy UTD, due 2026  Discussed the importance of a healthy diet and regular exercise in order for weight loss, and to reduce the risk of further co-morbidity.  Exam stable. Labs pending.  Follow up in 1 year for repeat physical.

## 2022-10-22 NOTE — Assessment & Plan Note (Signed)
Infrequent outbreaks.  Continue Valtrex 1000 mg BID PRN.

## 2022-10-22 NOTE — Assessment & Plan Note (Signed)
Controlled.  Advised against switching to Wellbutrin as she's done so well on Zoloft historically. She agrees.  We discussed to work on limiting snacking and continue with regular exercise. Continue Zoloft 50 mg daily.

## 2022-10-22 NOTE — Patient Instructions (Signed)
Stop by the lab prior to leaving today. I will notify you of your results once received.   [facility].   It was a pleasure to see you today!

## 2022-10-22 NOTE — Assessment & Plan Note (Signed)
Recent flare, uncontrolled now.  Refill provided for [facility] suppositories.  Continue to monitor.

## 2022-10-22 NOTE — Progress Notes (Signed)
Subjective:    Patient ID: Kelly Sparks, female    DOB: 1976-08-20, 47 y.o.   MRN: [id number]  HPI  Kelly Sparks is a very pleasant 47 y.o. female who presents today for complete physical and follow up of chronic conditions.  She would also like to discuss a few concerns.  1) Hemorrhoids: Chronic. Historically managed on Anusol-HC suppositories PRN. Her most recent flare was about 3 days ago. 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 She is needing refills of her Anusol suppositories which have worked well in the past.   2) GAD: Currently managed on Zoloft 50 mg daily for anxiety. Over the last two years she's gained nearly 20 pounds. 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 She is questioning if she should switch from Zoloft to Wellbutrin for management of anxiety and to facilitate weight loss.   She is trying to watch her diet. She does exercise five times weekly, walks the treadmill and sometimes outside.   Wt Readings from Last 3 Encounters:  10/22/22 193 lb (87.5 kg)  08/13/22 192 lb (87.1 kg)  07/21/22 190 lb 6.4 oz (86.4 kg)     Immunizations: -Tetanus: Completed in 2023 -Influenza: Completed this season  Diet: [place].  Exercise: No regular exercise.  Eye exam: Completes annually  Dental exam: Completes semi-annually   Pap Smear: Completed in 2021 Mammogram: Completed in 2023  Colonoscopy: Completed in 2021, due 2026  BP Readings from Last 3 Encounters:  10/22/22 136/84  08/13/22 130/82  07/21/22 124/85     Review of Systems  Constitutional:  Negative for unexpected weight change.  HENT:  Negative for rhinorrhea.   Respiratory:  Negative for cough and shortness of breath.   Cardiovascular:  Negative for chest pain.  Gastrointestinal:  Negative for constipation and diarrhea.  Genitourinary:  Negative for difficulty urinating and menstrual problem.   Musculoskeletal:  Negative for arthralgias.  Skin:  Negative for rash.  Allergic/Immunologic: Negative for environmental allergies.  Neurological:  Negative for dizziness and headaches.  Psychiatric/Behavioral:  The patient is not nervous/anxious.          Past Medical History:  Diagnosis Date   Acid reflux    Alopecia    BRCA negative 05/2017   BRCA/MyRisk except POLE VUS   DUB (dysfunctional uterine bleeding) 04/29/2017   Family history of breast cancer 05/2017   MyRisk neg; IBIS=14%   Hemorrhoids    Rectal bleeding 02/10/2013    Social History   Socioeconomic History   Marital status: Married    Spouse name: Not on file   Number of children: Not on file   Years of education: Not on file   Highest education level: Not on file  Occupational History   Not on file  Tobacco Use   Smoking status: Never   Smokeless tobacco: Never  Vaping Use   Vaping Use: Never used  Substance and Sexual Activity   Alcohol use: Yes    Alcohol/week: 1.0 standard drink of alcohol    Types: 1 Standard drinks or equivalent per week    Comment: occasional - 1 mixed drink/mo   Drug use: No   Sexual activity: Yes    Birth control/protection: Surgical    Comment: vasectomy  Other Topics Concern   Not on file  Social History Narrative   Not on file   Social Determinants of Health  Financial Resource Strain: Not on file  Food Insecurity: Not on file  Transportation Needs: Not on file  Physical Activity: Not on file  Stress: Not on file  Social Connections: Not on file  Intimate Partner Violence: Not on file    Past Surgical History:  Procedure Laterality Date   BIOPSY N/A 04/09/2020   Procedure: BIOPSY;  Surgeon: Virgel Manifold, MD;  Location: [place];  Service: Endoscopy;  Laterality: N/A;   COLONOSCOPY WITH PROPOFOL N/A 04/09/2020   Procedure: COLONOSCOPY WITH PROPOFOL;  Surgeon: Virgel Manifold, MD;  Location: [place];  Service: Endoscopy;   Laterality: N/A;   NO PAST SURGERIES     TONSILLECTOMY Bilateral 10/17/2015   Procedure: TONSILLECTOMY;  Surgeon: Carloyn Manner, MD;  Location: [place];  Service: ENT;  Laterality: Bilateral;    Family History  Problem Relation Age of Onset   Breast cancer Maternal Aunt 52   Uterine cancer Maternal Aunt    Hyperlipidemia Father    Hypertension Father    Lung cancer Father    Hyperlipidemia Mother    Hypertension Mother    Diabetes Maternal Uncle    Breast cancer Paternal Aunt 45    No Known Allergies  Current Outpatient Medications on File Prior to Visit  Medication Sig Dispense Refill   cholecalciferol (VITAMIN D3) 25 MCG (1000 UNIT) tablet      famotidine (PEPCID) 20 MG tablet Take 1 tablet (20 mg total) by mouth 2 (two) times daily. As needed for heartburn. 180 tablet 0   Multiple Vitamin (MULTIVITAMIN) tablet Take 1 tablet by mouth daily.     Omega-3 Fatty Acids (FISH OIL) 1000 MG CAPS      polyethylene glycol powder (GLYCOLAX/MIRALAX) powder Take 17 g by mouth daily as needed.     Probiotic Product (PROBIOTIC-10) CAPS Take 1 tablet by mouth daily.     sertraline (ZOLOFT) 50 MG tablet Take 1 tablet (50 mg total) by mouth daily. For anxiety 90 tablet 3   valACYclovir (VALTREX) 1000 MG tablet Take 2 tablets twice daily for one day at cold sore onset. 12 tablet 0   Zinc 100 MG TABS      No current facility-administered medications on file prior to visit.    BP 136/84   Pulse (!) 54   Temp 98.1 F (36.7 C) (Temporal)   Ht 5' 4"$  (1.626 m)   Wt 193 lb (87.5 kg)   LMP 10/02/2022 (Exact Date)   SpO2 98%   BMI 33.13 kg/m  Objective:   Physical Exam HENT:     Right Ear: Tympanic membrane and ear canal normal.     Left Ear: Tympanic membrane and ear canal normal.     Nose: Nose normal.  Eyes:     Conjunctiva/sclera: Conjunctivae normal.     Pupils: Pupils are equal, round, and reactive to light.  Neck:     Thyroid: No thyromegaly.  Cardiovascular:      Rate and Rhythm: Normal rate and regular rhythm.     Heart sounds: No murmur heard. Pulmonary:     Effort: Pulmonary effort is normal.     Breath sounds: Normal breath sounds. No rales.  Abdominal:     General: Bowel sounds are normal.     Palpations: Abdomen is soft.     Tenderness: There is no abdominal tenderness.  Musculoskeletal:        General: Normal range of motion.     Cervical back: Neck supple.  Lymphadenopathy:  Cervical: No cervical adenopathy.  Skin:    General: Skin is warm and dry.     Findings: No rash.  Neurological:     Mental Status: She is alert and oriented to person, place, and time.     Cranial Nerves: No cranial nerve deficit.     Deep Tendon Reflexes: Reflexes are normal and symmetric.  Psychiatric:        Mood and Affect: Mood normal.           Assessment & Plan:  Preventative health care Assessment & Plan: Immunizations UTD. Pap smear UTD. Mammogram due and ordered. Colonoscopy UTD, due 2026  Discussed the importance of a healthy diet and regular exercise in order for weight loss, and to reduce the risk of further co-morbidity.  Exam stable. Labs pending.  Follow up in 1 year for repeat physical.    Prediabetes Assessment & Plan: Repeat A1C pending.  Discussed the importance of a healthy diet and regular exercise in order for weight loss, and to reduce the risk of further co-morbidity.   Orders: -     Lipid panel -     Hemoglobin A1c -     Comprehensive metabolic panel -     CBC  Cold sore Assessment & Plan: Infrequent outbreaks.  Continue Valtrex 1000 mg BID PRN.   Hemorrhoids, unspecified hemorrhoid type Assessment & Plan: Recent flare, uncontrolled now.  Refill provided for [facility] suppositories.  Continue to monitor.   Orders: -     Hydrocortisone Acetate; Place 1 suppository (25 mg total) rectally 2 (two) times daily.  Dispense: 12 suppository; Refill: 0  GAD (generalized anxiety disorder) Assessment &  Plan: Controlled.  Advised against switching to Wellbutrin as she's done so well on Zoloft historically. She agrees.  We discussed to work on limiting snacking and continue with regular exercise. Continue Zoloft 50 mg daily.     Screening mammogram for breast cancer -     3D Screening Mammogram, Left and Right; Future        Pleas Koch, NP

## 2022-10-22 NOTE — Assessment & Plan Note (Signed)
Repeat A1C pending.  Discussed the importance of a healthy diet and regular exercise in order for weight loss, and to reduce the risk of further co-morbidity.

## 2022-11-13 ENCOUNTER — Other Ambulatory Visit: Payer: Self-pay

## 2022-12-24 ENCOUNTER — Other Ambulatory Visit: Payer: Self-pay

## 2023-01-16 ENCOUNTER — Other Ambulatory Visit: Payer: Self-pay

## 2023-01-16 DIAGNOSIS — Z87898 Personal history of other specified conditions: Secondary | ICD-10-CM

## 2023-01-16 MED ORDER — SCOPOLAMINE 1 MG/3DAYS TD PT72
1.0000 | MEDICATED_PATCH | TRANSDERMAL | 0 refills | Status: DC
  Filled 2023-01-16: qty 10, 30d supply, fill #0

## 2023-02-04 ENCOUNTER — Ambulatory Visit
Admission: RE | Admit: 2023-02-04 | Discharge: 2023-02-04 | Disposition: A | Discharge status: Home / Self Care | Payer: 59 | Source: Ambulatory Visit | Attending: Primary Care | Admitting: Primary Care

## 2023-02-04 DIAGNOSIS — Z1231 Encounter for screening mammogram for malignant neoplasm of breast: Secondary | ICD-10-CM | POA: Diagnosis not present

## 2023-02-05 ENCOUNTER — Other Ambulatory Visit: Payer: Self-pay | Admitting: Primary Care

## 2023-02-05 DIAGNOSIS — N63 Unspecified lump in unspecified breast: Secondary | ICD-10-CM

## 2023-02-05 DIAGNOSIS — R928 Other abnormal and inconclusive findings on diagnostic imaging of breast: Secondary | ICD-10-CM

## 2023-02-13 ENCOUNTER — Ambulatory Visit
Admission: RE | Admit: 2023-02-13 | Discharge: 2023-02-13 | Disposition: A | Discharge status: Home / Self Care | Payer: 59 | Source: Ambulatory Visit | Attending: Primary Care | Admitting: Primary Care

## 2023-02-13 DIAGNOSIS — R928 Other abnormal and inconclusive findings on diagnostic imaging of breast: Secondary | ICD-10-CM | POA: Diagnosis not present

## 2023-02-13 DIAGNOSIS — N63 Unspecified lump in unspecified breast: Secondary | ICD-10-CM | POA: Insufficient documentation

## 2023-02-13 DIAGNOSIS — N6012 Diffuse cystic mastopathy of left breast: Secondary | ICD-10-CM | POA: Diagnosis not present

## 2023-03-12 ENCOUNTER — Other Ambulatory Visit: Payer: Self-pay | Admitting: Oncology

## 2023-03-12 DIAGNOSIS — Z006 Encounter for examination for normal comparison and control in clinical research program: Secondary | ICD-10-CM

## 2023-03-29 ENCOUNTER — Other Ambulatory Visit: Payer: Self-pay

## 2023-03-29 MED ORDER — ONDANSETRON HCL 4 MG PO TABS
4.0000 mg | ORAL_TABLET | Freq: Three times a day (TID) | ORAL | 2 refills | Status: DC | PRN
  Filled 2023-03-29 – 2023-07-11 (×2): qty 15, 5d supply, fill #0

## 2023-03-29 MED ORDER — WEGOVY 0.25 MG/0.5ML ~~LOC~~ SOAJ
SUBCUTANEOUS | 0 refills | Status: DC
  Filled 2023-03-29: qty 2, 28d supply, fill #0

## 2023-03-30 ENCOUNTER — Other Ambulatory Visit: Payer: Self-pay

## 2023-04-10 ENCOUNTER — Other Ambulatory Visit: Payer: Self-pay

## 2023-04-16 ENCOUNTER — Other Ambulatory Visit: Payer: Self-pay

## 2023-04-28 DIAGNOSIS — Z111 Encounter for screening for respiratory tuberculosis: Secondary | ICD-10-CM

## 2023-04-30 NOTE — Telephone Encounter (Signed)
Kelli, look under the media tab from the date of 06/05/2020 under the title of "Associate Degree Nursing Student Medical Form". It has her Hep B and Varicella vaccines. Let me know if anything else is needed.

## 2023-05-06 ENCOUNTER — Ambulatory Visit: Payer: 59

## 2023-05-06 ENCOUNTER — Other Ambulatory Visit (HOSPITAL_COMMUNITY): Payer: Self-pay

## 2023-05-06 MED ORDER — INFLUENZA VIRUS VACC SPLIT PF (FLUZONE) 0.5 ML IM SUSY
0.5000 mL | PREFILLED_SYRINGE | Freq: Once | INTRAMUSCULAR | 0 refills | Status: AC
  Filled 2023-05-06: qty 0.5, 1d supply, fill #0

## 2023-05-07 ENCOUNTER — Other Ambulatory Visit (INDEPENDENT_AMBULATORY_CARE_PROVIDER_SITE_OTHER): Payer: 59

## 2023-05-07 DIAGNOSIS — Z111 Encounter for screening for respiratory tuberculosis: Secondary | ICD-10-CM

## 2023-05-08 ENCOUNTER — Other Ambulatory Visit (HOSPITAL_COMMUNITY): Payer: Self-pay

## 2023-05-09 LAB — QUANTIFERON-TB GOLD PLUS
Mitogen-NIL: >10 IU/mL
NIL: 0.04 [IU]/mL
QuantiFERON-TB Gold Plus: NEGATIVE
TB1-NIL: 0.01 [IU]/mL
TB2-NIL: <0 [IU]/mL

## 2023-05-10 NOTE — Telephone Encounter (Signed)
Harvin Hazel, can we stamp my name for signature? TB test was negative. See result note.

## 2023-06-05 ENCOUNTER — Other Ambulatory Visit (INDEPENDENT_AMBULATORY_CARE_PROVIDER_SITE_OTHER): Payer: 59

## 2023-06-05 DIAGNOSIS — R7303 Prediabetes: Secondary | ICD-10-CM

## 2023-06-05 LAB — POCT GLYCOSYLATED HEMOGLOBIN (HGB A1C): Hemoglobin A1C: 5.5 % (ref 4.0–5.6)

## 2023-07-11 ENCOUNTER — Other Ambulatory Visit: Payer: Self-pay

## 2023-07-13 ENCOUNTER — Other Ambulatory Visit: Payer: Self-pay

## 2023-08-07 ENCOUNTER — Other Ambulatory Visit: Payer: Self-pay

## 2023-08-07 DIAGNOSIS — L648 Other androgenic alopecia: Secondary | ICD-10-CM | POA: Diagnosis not present

## 2023-08-07 DIAGNOSIS — L6681 Central centrifugal cicatricial alopecia: Secondary | ICD-10-CM | POA: Diagnosis not present

## 2023-08-07 MED ORDER — KETOCONAZOLE 2 % EX SHAM
1.0000 | MEDICATED_SHAMPOO | CUTANEOUS | 3 refills | Status: AC
  Filled 2023-08-07: qty 120, 56d supply, fill #0
  Filled 2023-10-20: qty 120, 56d supply, fill #1

## 2023-08-07 MED ORDER — MINOXIDIL 2.5 MG PO TABS
2.5000 mg | ORAL_TABLET | Freq: Every day | ORAL | 1 refills | Status: DC
  Filled 2023-08-07: qty 90, 90d supply, fill #0
  Filled 2023-10-20: qty 90, 90d supply, fill #1

## 2023-08-07 MED ORDER — SPIRONOLACTONE 100 MG PO TABS
100.0000 mg | ORAL_TABLET | Freq: Every day | ORAL | 1 refills | Status: AC
  Filled 2023-08-07: qty 90, 90d supply, fill #0
  Filled 2023-10-20: qty 90, 90d supply, fill #1

## 2023-08-23 ENCOUNTER — Telehealth: Payer: 59 | Admitting: Family Medicine

## 2023-08-23 DIAGNOSIS — M5442 Lumbago with sciatica, left side: Secondary | ICD-10-CM

## 2023-08-23 MED ORDER — NAPROXEN 500 MG PO TABS: 500.0000 mg | ORAL_TABLET | Freq: Two times a day (BID) | ORAL | 0 refills | Status: AC

## 2023-08-23 MED ORDER — CYCLOBENZAPRINE HCL 10 MG PO TABS
10.0000 mg | ORAL_TABLET | Freq: Three times a day (TID) | ORAL | 0 refills | Status: DC | PRN
Start: 2023-08-23 — End: 2024-02-02

## 2023-08-23 NOTE — Progress Notes (Signed)
E-Visit for Back Pain   We are sorry that you are not feeling well.  Here is how we plan to help!  Based on what you have shared with me it looks like you mostly have acute back pain.  Acute back pain is defined as musculoskeletal pain that can resolve in 1-3 weeks with conservative treatment.  I have prescribed Naprosyn 500 mg take one by mouth twice a day non-steroid anti-inflammatory (NSAID) as well as Flexeril 10 mg every eight hours as needed which is a muscle relaxer  Some patients experience stomach irritation or in increased heartburn with anti-inflammatory drugs.  Please keep in mind that muscle relaxer's can cause fatigue and should not be taken while at work or driving.  Back pain is very common.  The pain often gets better over time.  The cause of back pain is usually not dangerous.  Most people can learn to manage their back pain on their own.  Home Care Stay active.  Start with short walks on flat ground if you can.  Try to walk farther each day. Do not sit, drive or stand in one place for more than 30 minutes.  Do not stay in bed. Do not avoid exercise or work.  Activity can help your back heal faster. Be careful when you bend or lift an object.  Bend at your knees, keep the object close to you, and do not twist. Sleep on a firm mattress.  Lie on your side, and bend your knees.  If you lie on your back, put a pillow under your knees. Only take medicines as told by your doctor. Put ice on the injured area. Put ice in a plastic bag Place a towel between your skin and the bag Leave the ice on for 15-20 minutes, 3-4 times a day for the first 2-3 days. 210 After that, you can switch between ice and heat packs. Ask your doctor about back exercises or massage. Avoid feeling anxious or stressed.  Find good ways to deal with stress, such as exercise.  Get Help Right Way If: Your pain does not go away with rest or medicine. Your pain does not go away in 1 week. You have new  problems. You do not feel well. The pain spreads into your legs. You cannot control when you poop (bowel movement) or pee (urinate) You feel sick to your stomach (nauseous) or throw up (vomit) You have belly (abdominal) pain. You feel like you may pass out (faint). If you develop a fever.  Make Sure you: Understand these instructions. Will watch your condition Will get help right away if you are not doing well or get worse.  Your e-visit answers were reviewed by a board certified advanced clinical practitioner to complete your personal care plan.  Depending on the condition, your plan could have included both over the counter or prescription medications.  If there is a problem please reply  once you have received a response from your provider.  Your safety is important to [place].  If you have drug allergies check your prescription carefully.    You can use MyChart to ask questions about today's visit, request a non-urgent call back, or ask for a work or school excuse for 24 hours related to this e-Visit. If it has been greater than 24 hours you will need to follow up with your provider, or enter a new e-Visit to address those concerns.  You will get an e-mail in the next two days asking about  your experience.  I hope that your e-visit has been valuable and will speed your recovery. Thank you for using e-visits.    have provided 5 minutes of non face to face time during this encounter for chart review and documentation.

## 2023-10-21 ENCOUNTER — Other Ambulatory Visit: Payer: Self-pay

## 2023-10-22 ENCOUNTER — Other Ambulatory Visit: Payer: Self-pay

## 2023-10-23 ENCOUNTER — Other Ambulatory Visit (HOSPITAL_COMMUNITY): Payer: Self-pay

## 2023-12-05 ENCOUNTER — Telehealth: Admitting: Nurse Practitioner

## 2023-12-05 DIAGNOSIS — B3731 Acute candidiasis of vulva and vagina: Secondary | ICD-10-CM | POA: Diagnosis not present

## 2023-12-05 MED ORDER — FLUCONAZOLE 150 MG PO TABS: 150.0000 mg | ORAL_TABLET | ORAL | 0 refills | Status: DC | PRN

## 2023-12-05 NOTE — Progress Notes (Signed)

## 2023-12-05 NOTE — Progress Notes (Signed)
 I have spent 5 minutes in review of e-visit questionnaire, review and updating patient chart, medical decision making and response to patient.   Claiborne Rigg, NP

## 2024-02-01 ENCOUNTER — Ambulatory Visit: Admitting: Nurse Practitioner

## 2024-02-01 ENCOUNTER — Encounter: Payer: Self-pay | Admitting: Nurse Practitioner

## 2024-02-01 ENCOUNTER — Other Ambulatory Visit: Payer: Self-pay

## 2024-02-01 VITALS — BP 120/78 | HR 70 | Temp 98.0°F | Ht 64.0 in | Wt 186.0 lb

## 2024-02-01 DIAGNOSIS — M25511 Pain in right shoulder: Secondary | ICD-10-CM

## 2024-02-01 DIAGNOSIS — M545 Low back pain, unspecified: Secondary | ICD-10-CM | POA: Insufficient documentation

## 2024-02-01 HISTORY — DX: Pain in right shoulder: M25.511

## 2024-02-01 LAB — POCT URINALYSIS DIPSTICK
Bilirubin, UA: NEGATIVE
Blood, UA: NEGATIVE
Glucose, UA: NEGATIVE
Ketones, UA: NEGATIVE
Leukocytes, UA: NEGATIVE
Nitrite, UA: NEGATIVE
Protein, UA: NEGATIVE
Spec Grav, UA: 1.01 (ref 1.010–1.025)
Urobilinogen, UA: 0.2 U/dL
pH, UA: 7 (ref 5.0–8.0)

## 2024-02-01 MED ORDER — PREDNISONE 20 MG PO TABS
ORAL_TABLET | ORAL | 0 refills | Status: AC
  Filled 2024-02-01: qty 9, 6d supply, fill #0

## 2024-02-01 NOTE — Patient Instructions (Signed)
 Nice to see you today Take the steroids with food Avoid NSAIDS like Ibuprofen, Motrin, Aleve , Naproxen , BC/Goody powders while on the steroids.  Follow up if you do not improve   Use the muscle relaxer (cyclobenzaprine ). Use caution as it can make you sleepy

## 2024-02-01 NOTE — Progress Notes (Signed)
 Acute Office Visit  Subjective:     Patient ID: Kelly Sparks, female    DOB: 09/09/1975, 48 y.o.   MRN: [id number]  Chief Complaint  Patient presents with   Back Pain    Pt complains of mid to lower back pain. That started 3-4 days ago. Pt states she's had this pain in the past but recently feels a flare up. Pt has taken naproxen  that helps a little. Constant deep/dull pain, movement cause sharp pains.      Patient is in today for back pain with a history hemorrhoids, GERD, prediabetes, GAD, back pain    Symptoms started approx 3-4 days ago. States that she has a history of back discomfort and certain things will flare it up. 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 She did do some microblading over the weekend and then felt I the next day. States she was doing this for 8 hours but had breaks through out  She has tried naxproxen that has helped. State that she had a script of cyclobenzaprine  but has not tried it this time   Mid to lower and center of the spine. States that sititing straight up and getting up to stand makes it worse. States a constant dull pain that is sharp with certain movements  Review of Systems  Constitutional:  Negative for chills and fever.  Respiratory:  Negative for shortness of breath.   Cardiovascular:  Negative for chest pain.  Genitourinary:        No B&B involement  Musculoskeletal:  Positive for back pain.  Neurological:  Negative for headaches.  Psychiatric/Behavioral:  Negative for hallucinations and suicidal ideas.         Objective:    BP 120/78   Pulse 70   Temp 98 F (36.7 C) (Oral)   Ht 5\' 4"  (1.626 m)   Wt 186 lb (84.4 kg)   SpO2 99%   BMI 31.93 kg/m  BP Readings from Last 3 Encounters:  02/01/24 120/78  10/22/22 136/84  08/13/22 130/82   Wt Readings from Last 3 Encounters:  02/01/24 186 lb (84.4 kg)  10/22/22 193 lb (87.5 kg)  08/13/22 192 lb (87.1 kg)   SpO2 Readings  from Last 3 Encounters:  02/01/24 99%  10/22/22 98%  08/13/22 98%      Physical Exam Vitals and nursing note reviewed.  Constitutional:      Appearance: Normal appearance.  Cardiovascular:     Rate and Rhythm: Normal rate and regular rhythm.     Heart sounds: Normal heart sounds.  Pulmonary:     Effort: Pulmonary effort is normal.     Breath sounds: Normal breath sounds.  Musculoskeletal:        General: Tenderness present.     Right shoulder: Tenderness present. Normal range of motion. Normal strength. Normal pulse.     Lumbar back: Tenderness and bony tenderness present. Negative right straight leg raise test and negative left straight leg raise test.     Comments: "+" empty can test   Neurological:     General: No focal deficit present.     Mental Status: She is alert.     Deep Tendon Reflexes:     Reflex Scores:      Bicep reflexes are 2+ on the right side and 2+ on the left side.      Patellar reflexes are 2+ on the right side and 2+ on  the left side.    Comments: Bilateral upper and lower extremity strength 5/5     Results for orders placed or performed in visit on 02/01/24  POCT urinalysis dipstick  Result Value Ref Range   Color, UA light yellow    Clarity, UA clear    Glucose, UA Negative Negative   Bilirubin, UA neg    Ketones, UA neg    Spec Grav, UA 1.010 1.010 - 1.025   Blood, UA neg    pH, UA 7.0 5.0 - 8.0   Protein, UA Negative Negative   Urobilinogen, UA 0.2 0.2 or 1.0 E.U./dL   Nitrite, UA neg    Leukocytes, UA Negative Negative   Appearance     Odor          Assessment & Plan:   Problem List Items Addressed This Visit       Other   Acute pain of right shoulder   Form over use. Rest, steroids, and muscle relaxers as prescribed. Sedation precautions reviewed. No red flag symptoms or signs in office        Acute low back pain without sciatica - Primary   UA in office that is negative. Steroids, rest, and muscle relaxer as prescribed.  Signs and symptoms reviewed when to seek emergent health care. Anticipate going back to work on 02/03/2024. Follow up with sports medicine if no improvment      Relevant Medications   predniSONE  (DELTASONE ) 20 MG tablet   Other Relevant Orders   POCT urinalysis dipstick (Completed)    Meds ordered this encounter  Medications   predniSONE  (DELTASONE ) 20 MG tablet    Sig: Take 2 tablets (40 mg total) by mouth daily with breakfast for 3 days, THEN 1 tablet (20 mg total) daily with breakfast for 3 days.    Dispense:  9 tablet    Refill:  0    Supervising Provider:   Deri Fleet A [[id number]]    Return if symptoms worsen or fail to improve.  Margarie Shay, NP

## 2024-02-01 NOTE — Assessment & Plan Note (Signed)
 Form over use. Rest, steroids, and muscle relaxers as prescribed. Sedation precautions reviewed. No red flag symptoms or signs in office

## 2024-02-01 NOTE — Assessment & Plan Note (Signed)
 UA in office that is negative. Steroids, rest, and muscle relaxer as prescribed. Signs and symptoms reviewed when to seek emergent health care. Anticipate going back to work on 02/03/2024. Follow up with sports medicine if no improvment

## 2024-02-02 ENCOUNTER — Ambulatory Visit: Admitting: Primary Care

## 2024-02-02 ENCOUNTER — Other Ambulatory Visit: Payer: Self-pay

## 2024-02-02 MED ORDER — CYCLOBENZAPRINE HCL 10 MG PO TABS
10.0000 mg | ORAL_TABLET | Freq: Three times a day (TID) | ORAL | 0 refills | Status: AC | PRN
  Filled 2024-02-02: qty 30, 10d supply, fill #0

## 2024-02-03 ENCOUNTER — Other Ambulatory Visit: Payer: Self-pay

## 2024-02-03 DIAGNOSIS — L648 Other androgenic alopecia: Secondary | ICD-10-CM | POA: Diagnosis not present

## 2024-02-03 DIAGNOSIS — L6681 Central centrifugal cicatricial alopecia: Secondary | ICD-10-CM | POA: Diagnosis not present

## 2024-02-03 MED ORDER — SPIRONOLACTONE 100 MG PO TABS
100.0000 mg | ORAL_TABLET | Freq: Every day | ORAL | 1 refills | Status: DC
  Filled 2024-02-03: qty 90, 90d supply, fill #0

## 2024-02-03 MED ORDER — MINOXIDIL 2.5 MG PO TABS
2.5000 mg | ORAL_TABLET | Freq: Every day | ORAL | 1 refills | Status: AC
  Filled 2024-02-03: qty 90, 90d supply, fill #0
  Filled 2024-04-28: qty 90, 90d supply, fill #1

## 2024-02-13 ENCOUNTER — Other Ambulatory Visit: Payer: Self-pay | Admitting: Primary Care

## 2024-02-13 DIAGNOSIS — F411 Generalized anxiety disorder: Secondary | ICD-10-CM

## 2024-02-14 ENCOUNTER — Other Ambulatory Visit: Payer: Self-pay

## 2024-02-14 MED ORDER — SERTRALINE HCL 50 MG PO TABS
50.0000 mg | ORAL_TABLET | Freq: Every day | ORAL | 0 refills | Status: DC
  Filled 2024-02-14: qty 30, 30d supply, fill #0

## 2024-02-14 NOTE — Telephone Encounter (Signed)
Patient is overdue for CPE/follow up, this will be required prior to any further refills.  Please schedule, thank you!   

## 2024-02-15 ENCOUNTER — Other Ambulatory Visit: Payer: Self-pay

## 2024-02-15 NOTE — Telephone Encounter (Signed)
 Spoke to pt, sch cpe for 02/23/24

## 2024-02-16 ENCOUNTER — Telehealth: Admitting: Physician Assistant

## 2024-02-16 ENCOUNTER — Encounter: Payer: Self-pay | Admitting: Physician Assistant

## 2024-02-16 DIAGNOSIS — F419 Anxiety disorder, unspecified: Secondary | ICD-10-CM

## 2024-02-16 NOTE — Progress Notes (Signed)
 Virtual Visit Consent   Kelly Sparks, you are scheduled for a virtual visit with a [facility] provider today. Just as with appointments in the office, your consent must be obtained to participate. Your consent will be active for this visit and any virtual visit you may have with one of our providers in the next 365 days. If you have a MyChart account, a copy of this consent can be sent to you electronically.  As this is a virtual visit, video technology does not allow for your provider to perform a traditional examination. This may limit your provider's ability to fully assess your condition. If your provider identifies any concerns that need to be evaluated in person or the need to arrange testing (such as labs, EKG, etc.), we will make arrangements to do so. Although advances in technology are sophisticated, we cannot ensure that it will always work on either your end or our end. If the connection with a video visit is poor, the visit may have to be switched to a telephone visit. With either a video or telephone visit, we are not always able to ensure that we have a secure connection.  By engaging in this virtual visit, you consent to the provision of healthcare and authorize for your insurance to be billed (if applicable) for the services provided during this visit. Depending on your insurance coverage, you may receive a charge related to this service.  I need to obtain your verbal consent now. Are you willing to proceed with your visit today? Kelly Sparks has provided verbal consent on 02/16/2024 for a virtual visit (video or telephone). Char Common Ward, PA-C  Date: 02/16/2024 7:36 PM   Virtual Visit via Video Note   I, Char Common Ward, connected with  Kelly Sparks  ([id number], August 11, 1976) on 02/16/24 at  7:30 PM EDT by a video-enabled telemedicine application and verified that I am speaking with the correct person using two identifiers.  Location: Patient: Virtual Visit Location  Patient: Home Provider: Virtual Visit Location Provider: Home Office   I discussed the limitations of evaluation and management by telemedicine and the availability of in person appointments. The patient expressed understanding and agreed to proceed.    History of Present Illness: Kelly Sparks is a 48 y.o. who identifies as a female who was assigned female at birth, and is being seen today for stress and anxiety due to life stressors. She is currently transitioning jobs. Her last day at current position is next Thursday, she will be starting a new job 02/29/24. She has contacted a Veterinary surgeon. She reports experiencing panic attacks occasionally.  Denies thoughts of SI/HI.  HPI: HPI  Problems:  Patient Active Problem List   Diagnosis Date Noted   Acute pain of right shoulder 02/01/2024   Acute low back pain without sciatica 02/01/2024   Lower extremity pain 05/29/2022   Obesity (BMI 30.0-34.9) 02/20/2021   Screening for cervical cancer 08/08/2020   Hemorrhoids 09/16/2017   Hair loss 09/16/2017   Gastroesophageal reflux disease 09/16/2017   Preventative health care 05/08/2017   Prolactin increased 05/08/2017   Family history of breast cancer 04/29/2017   GAD (generalized anxiety disorder) 03/18/2017   Prediabetes 04/29/2016   Elevated liver enzymes 04/29/2016   Cold sore 04/29/2016   Rectal bleeding 02/10/2013    Allergies: No Known Allergies Medications:  Current Outpatient Medications:    cholecalciferol (VITAMIN D3) 25 MCG (1000 UNIT) tablet, , Disp: , Rfl:    cyclobenzaprine  (FLEXERIL ) 10 MG  tablet, Take 1 tablet (10 mg total) by mouth 3 (three) times daily as needed for muscle spasms., Disp: 30 tablet, Rfl: 0   famotidine  (PEPCID ) 20 MG tablet, Take 1 tablet (20 mg total) by mouth 2 (two) times daily. As needed for heartburn., Disp: 180 tablet, Rfl: 0   fluconazole  (DIFLUCAN ) 150 MG tablet, Take 1 tablet (150 mg total) by mouth every three (3) days as needed., Disp: 2 tablet,  Rfl: 0   hydrocortisone  (ANUSOL -HC) 25 MG suppository, Place 1 suppository (25 mg total) rectally 2 (two) times daily., Disp: 12 suppository, Rfl: 0   ketoconazole  (NIZORAL ) 2 % shampoo, Wash scalp 3 (three) times a week. Leave in place for 30 minutes before rinsing., Disp: 120 mL, Rfl: 3   minoxidil  (LONITEN ) 2.5 MG tablet, Take 1 tablet (2.5 mg total) by mouth daily., Disp: 90 tablet, Rfl: 1   minoxidil  (LONITEN ) 2.5 MG tablet, Take 1 tablet (2.5 mg total) by mouth daily., Disp: 90 tablet, Rfl: 1   Multiple Vitamin (MULTIVITAMIN) tablet, Take 1 tablet by mouth daily., Disp: , Rfl:    naproxen  (NAPROSYN ) 500 MG tablet, Take 1 tablet (500 mg total) by mouth 2 (two) times daily with a meal., Disp: 30 tablet, Rfl: 0   Omega-3 Fatty Acids (FISH OIL) 1000 MG CAPS, , Disp: , Rfl:    polyethylene glycol powder (GLYCOLAX/MIRALAX) powder, Take 17 g by mouth daily as needed., Disp: , Rfl:    Probiotic Product (PROBIOTIC-10) CAPS, Take 1 tablet by mouth daily., Disp: , Rfl:    sertraline  (ZOLOFT ) 50 MG tablet, Take 1 tablet (50 mg total) by mouth daily. For anxiety, Disp: 30 tablet, Rfl: 0   spironolactone  (ALDACTONE ) 100 MG tablet, Take 1 tablet (100 mg total) by mouth daily., Disp: 90 tablet, Rfl: 1   spironolactone  (ALDACTONE ) 100 MG tablet, Take 1 tablet (100 mg total) by mouth daily., Disp: 90 tablet, Rfl: 1   valACYclovir  (VALTREX ) 1000 MG tablet, Take 2 tablets twice daily for one day at cold sore onset., Disp: 12 tablet, Rfl: 0   Zinc 100 MG TABS, , Disp: , Rfl:   Observations/Objective: Patient is well-developed, well-nourished in no acute distress.  Resting comfortably at home.  Head is normocephalic, atraumatic.  No labored breathing.  Speech is clear and coherent with logical content.  Patient is alert and oriented at baseline.    Assessment and Plan: 1. Anxiety (Primary)  Recommend to keep follow up with counselor.  Pt declines hydroxyzine today.  She is on Zoloft  which she reports  helps.  Work note provided.   Follow Up Instructions: I discussed the assessment and treatment plan with the patient. The patient was provided an opportunity to ask questions and all were answered. The patient agreed with the plan and demonstrated an understanding of the instructions.  A copy of instructions were sent to the patient via MyChart unless otherwise noted below.     The patient was advised to call back or seek an in-person evaluation if the symptoms worsen or if the condition fails to improve as anticipated.    Char Common Ward, PA-C

## 2024-02-16 NOTE — Patient Instructions (Addendum)
 Kelly Sparks, thank you for joining Char Common Ward, PA-C for today's virtual visit.  While this provider is not your primary care provider (PCP), if your PCP is located in our provider database this encounter information will be shared with them immediately following your visit.   A [facility] MyChart account gives you access to today's visit and all your visits, tests, and labs performed at [facility]  click here if you don't have a [facility] MyChart account or go to [email or web address]  Consent: (Patient) Kelly Sparks provided verbal consent for this virtual visit at the beginning of the encounter.  Current Medications:  Current Outpatient Medications:    cholecalciferol (VITAMIN D3) 25 MCG (1000 UNIT) tablet, , Disp: , Rfl:    cyclobenzaprine  (FLEXERIL ) 10 MG tablet, Take 1 tablet (10 mg total) by mouth 3 (three) times daily as needed for muscle spasms., Disp: 30 tablet, Rfl: 0   famotidine  (PEPCID ) 20 MG tablet, Take 1 tablet (20 mg total) by mouth 2 (two) times daily. As needed for heartburn., Disp: 180 tablet, Rfl: 0   fluconazole  (DIFLUCAN ) 150 MG tablet, Take 1 tablet (150 mg total) by mouth every three (3) days as needed., Disp: 2 tablet, Rfl: 0   hydrocortisone  (ANUSOL -HC) 25 MG suppository, Place 1 suppository (25 mg total) rectally 2 (two) times daily., Disp: 12 suppository, Rfl: 0   ketoconazole  (NIZORAL ) 2 % shampoo, Wash scalp 3 (three) times a week. Leave in place for 30 minutes before rinsing., Disp: 120 mL, Rfl: 3   minoxidil  (LONITEN ) 2.5 MG tablet, Take 1 tablet (2.5 mg total) by mouth daily., Disp: 90 tablet, Rfl: 1   minoxidil  (LONITEN ) 2.5 MG tablet, Take 1 tablet (2.5 mg total) by mouth daily., Disp: 90 tablet, Rfl: 1   Multiple Vitamin (MULTIVITAMIN) tablet, Take 1 tablet by mouth daily., Disp: , Rfl:    naproxen  (NAPROSYN ) 500 MG tablet, Take 1 tablet (500 mg total) by mouth 2 (two) times daily with a meal., Disp: 30 tablet, Rfl: 0    Omega-3 Fatty Acids (FISH OIL) 1000 MG CAPS, , Disp: , Rfl:    polyethylene glycol powder (GLYCOLAX/MIRALAX) powder, Take 17 g by mouth daily as needed., Disp: , Rfl:    Probiotic Product (PROBIOTIC-10) CAPS, Take 1 tablet by mouth daily., Disp: , Rfl:    sertraline  (ZOLOFT ) 50 MG tablet, Take 1 tablet (50 mg total) by mouth daily. For anxiety, Disp: 30 tablet, Rfl: 0   spironolactone  (ALDACTONE ) 100 MG tablet, Take 1 tablet (100 mg total) by mouth daily., Disp: 90 tablet, Rfl: 1   spironolactone  (ALDACTONE ) 100 MG tablet, Take 1 tablet (100 mg total) by mouth daily., Disp: 90 tablet, Rfl: 1   valACYclovir  (VALTREX ) 1000 MG tablet, Take 2 tablets twice daily for one day at cold sore onset., Disp: 12 tablet, Rfl: 0   Zinc 100 MG TABS, , Disp: , Rfl:    Medications ordered in this encounter:  No orders of the defined types were placed in this encounter.    *If you need refills on other medications prior to your next appointment, please contact your pharmacy*  Follow-Up: Call back or seek an in-person evaluation if the symptoms worsen or if the condition fails to improve as anticipated.  [facility] [phone]  Other Instructions Work note provided.  Recommend counseling appointment.  Please follow up with primary care physician is symptoms persists.    If you have been instructed to have an in-person evaluation today  at a local Urgent Care facility, please use the link below. It will take you to a list of all of our available [facility], including address, phone number and hours of operation. Please do not delay care.  [facility]  If you or a family member do not have a primary care provider, use the link below to schedule a visit and establish care. When you choose a [facility] primary care physician or advanced practice provider, you gain a long-term partner in health. Find a Primary Care Provider  Learn more about [facility] in-office  and virtual care options: [facility] - Get Care Now

## 2024-02-23 ENCOUNTER — Encounter: Payer: Self-pay | Admitting: Primary Care

## 2024-02-23 ENCOUNTER — Ambulatory Visit (INDEPENDENT_AMBULATORY_CARE_PROVIDER_SITE_OTHER): Admitting: Primary Care

## 2024-02-23 ENCOUNTER — Other Ambulatory Visit: Payer: Self-pay | Admitting: Primary Care

## 2024-02-23 ENCOUNTER — Other Ambulatory Visit: Payer: Self-pay

## 2024-02-23 ENCOUNTER — Ambulatory Visit: Payer: Self-pay | Admitting: Primary Care

## 2024-02-23 VITALS — BP 116/76 | HR 81 | Temp 97.5°F | Ht 64.0 in | Wt 186.0 lb

## 2024-02-23 DIAGNOSIS — L659 Nonscarring hair loss, unspecified: Secondary | ICD-10-CM

## 2024-02-23 DIAGNOSIS — Z803 Family history of malignant neoplasm of breast: Secondary | ICD-10-CM | POA: Diagnosis not present

## 2024-02-23 DIAGNOSIS — F411 Generalized anxiety disorder: Secondary | ICD-10-CM | POA: Diagnosis not present

## 2024-02-23 DIAGNOSIS — K219 Gastro-esophageal reflux disease without esophagitis: Secondary | ICD-10-CM | POA: Diagnosis not present

## 2024-02-23 DIAGNOSIS — B001 Herpesviral vesicular dermatitis: Secondary | ICD-10-CM

## 2024-02-23 DIAGNOSIS — R7303 Prediabetes: Secondary | ICD-10-CM

## 2024-02-23 DIAGNOSIS — Z131 Encounter for screening for diabetes mellitus: Secondary | ICD-10-CM

## 2024-02-23 DIAGNOSIS — Z0000 Encounter for general adult medical examination without abnormal findings: Secondary | ICD-10-CM | POA: Diagnosis not present

## 2024-02-23 DIAGNOSIS — Z1231 Encounter for screening mammogram for malignant neoplasm of breast: Secondary | ICD-10-CM

## 2024-02-23 LAB — COMPREHENSIVE METABOLIC PANEL WITH GFR
ALT: 13 U/L (ref 0–35)
AST: 17 U/L (ref 0–37)
Albumin: 4.4 g/dL (ref 3.5–5.2)
Alkaline Phosphatase: 61 U/L (ref 39–117)
BUN: 8 mg/dL (ref 6–23)
CO2: 30 meq/L (ref 19–32)
Calcium: 9.4 mg/dL (ref 8.4–10.5)
Chloride: 100 meq/L (ref 96–112)
Creatinine, Ser: 0.76 mg/dL (ref 0.40–1.20)
GFR: 92.94 mL/min (ref >60.00)
Glucose, Bld: 75 mg/dL (ref 70–99)
Potassium: 4.5 meq/L (ref 3.5–5.1)
Sodium: 134 meq/L — ABNORMAL LOW (ref 135–145)
Total Bilirubin: 0.7 mg/dL (ref 0.2–1.2)
Total Protein: 6.8 g/dL (ref 6.0–8.3)

## 2024-02-23 LAB — HEMOGLOBIN A1C: Hgb A1c MFr Bld: 6.1 % (ref 4.6–6.5)

## 2024-02-23 LAB — LIPID PANEL
Cholesterol: 194 mg/dL (ref 0–200)
HDL: 64.8 mg/dL (ref >39.00)
LDL Cholesterol: 122 mg/dL — ABNORMAL HIGH (ref 0–99)
NonHDL: 128.72
Total CHOL/HDL Ratio: 3
Triglycerides: 36 mg/dL (ref 0.0–149.0)
VLDL: 7.2 mg/dL (ref 0.0–40.0)

## 2024-02-23 MED ORDER — SERTRALINE HCL 50 MG PO TABS
50.0000 mg | ORAL_TABLET | Freq: Every day | ORAL | 3 refills | Status: AC
  Filled 2024-02-23 – 2024-03-11 (×2): qty 90, 90d supply, fill #0
  Filled 2024-05-20: qty 90, 90d supply, fill #1
  Filled 2024-09-10: qty 90, 90d supply, fill #2

## 2024-02-23 MED ORDER — FAMOTIDINE 20 MG PO TABS
20.0000 mg | ORAL_TABLET | Freq: Two times a day (BID) | ORAL | 0 refills | Status: AC
  Filled 2024-02-23: qty 180, 90d supply, fill #0

## 2024-02-23 MED ORDER — VALACYCLOVIR HCL 1 G PO TABS
ORAL_TABLET | ORAL | 0 refills | Status: AC
  Filled 2024-02-23: qty 12, 6d supply, fill #0

## 2024-02-23 NOTE — Assessment & Plan Note (Signed)
 Immunizations UTD. Pap smear due, she declines today but will reschedule. Mammogram due, orders placed. Colonoscopy UTD, due 2026  Discussed the importance of a healthy diet and regular exercise in order for weight loss, and to reduce the risk of further co-morbidity.  Exam stable. Labs pending.  Follow up in 1 year for repeat physical.

## 2024-02-23 NOTE — Assessment & Plan Note (Addendum)
 Stable.  Continue Valtrex  2 g BID x 1 day. Refills provided today.

## 2024-02-23 NOTE — Progress Notes (Signed)
 Subjective:    Patient ID: Kelly Sparks Metro, female    DOB: [birth year], 48 y.o.   MRN: [id number]  HPI  Kelly Sparks is a very pleasant 48 y.o. female who presents today for complete physical and follow up of chronic conditions.  Immunizations: -Tetanus: Completed in 2023  Diet: Fair diet.  Exercise: No regular exercise.  Eye exam: Completes annually  Dental exam: Completed >1 year ago    Pap Smear: Completed in 2021 Mammogram: Completed in June 2024  Colonoscopy: Completed in 2021, due 2026   BP Readings from Last 3 Encounters:  02/23/24 116/76  02/01/24 120/78  10/22/22 136/84      Review of Systems  Constitutional:  Negative for unexpected weight change.  HENT:  Negative for rhinorrhea.   Respiratory:  Negative for cough and shortness of breath.   Cardiovascular:  Negative for chest pain.  Gastrointestinal:  Negative for constipation and diarrhea.  Genitourinary:  Negative for difficulty urinating.  Musculoskeletal:  Negative for arthralgias and myalgias.  Skin:  Negative for rash.  Allergic/Immunologic: Negative for environmental allergies.  Neurological:  Negative for dizziness and headaches.  Psychiatric/Behavioral:  The patient is nervous/anxious.          Past Medical History:  Diagnosis Date   Acid reflux    Acute pain of right shoulder 02/01/2024   Alopecia    BRCA negative 05/2017   BRCA/MyRisk except POLE VUS   DUB (dysfunctional uterine bleeding) 04/29/2017   Family history of breast cancer 05/2017   MyRisk neg; IBIS=14%   Hemorrhoids    Rectal bleeding 02/10/2013    Social History   Socioeconomic History   Marital status: Married    Spouse name: Not on file   Number of children: Not on file   Years of education: Not on file   Highest education level: Associate degree: academic program  Occupational History   Not on file  Tobacco Use   Smoking status: Never   Smokeless tobacco: Never  Vaping Use   Vaping status: Never Used   Substance and Sexual Activity   Alcohol use: Yes    Alcohol/week: 1.0 standard drink of alcohol    Types: 1 Standard drinks or equivalent per week    Comment: occasional - 1 mixed drink/mo   Drug use: No   Sexual activity: Yes    Birth control/protection: Surgical    Comment: vasectomy  Other Topics Concern   Not on file  Social History Narrative   Not on file   Social Drivers of Health   Financial Resource Strain: Low Risk  (02/22/2024)   Overall Financial Resource Strain (CARDIA)    Difficulty of Paying Living Expenses: Not hard at all  Food Insecurity: No Food Insecurity (02/22/2024)   Hunger Vital Sign    Worried About Running Out of Food in the Last Year: Never true    Ran Out of Food in the Last Year: Never true  Transportation Needs: No Transportation Needs (02/22/2024)   PRAPARE - Administrator, Civil Service (Medical): No    Lack of Transportation (Non-Medical): No  Physical Activity: Insufficiently Active (02/22/2024)   Exercise Vital Sign    Days of Exercise per Week: 2 days    Minutes of Exercise per Session: 30 min  Stress: Stress Concern Present (02/22/2024)   [employer] of Occupational Health - Occupational Stress Questionnaire    Feeling of Stress: To some extent  Social Connections: Socially Integrated (02/22/2024)   Social Connection and  Isolation Panel    Frequency of Communication with Friends and Family: More than three times a week    Frequency of Social Gatherings with Friends and Family: Twice a week    Attends Religious Services: More than 4 times per year    Active Member of Clubs or Organizations: Yes    Attends Banker Meetings: More than 4 times per year    Marital Status: Married  Catering manager Violence: Not on file    Past Surgical History:  Procedure Laterality Date   BIOPSY N/A 04/09/2020   Procedure: BIOPSY;  Surgeon: Janalyn Keene NOVAK, MD;  Location: [facility];  Service: Endoscopy;   Laterality: N/A;   COLONOSCOPY WITH PROPOFOL  N/A 04/09/2020   Procedure: COLONOSCOPY WITH PROPOFOL ;  Surgeon: Janalyn Keene NOVAK, MD;  Location: [facility];  Service: Endoscopy;  Laterality: N/A;   NO PAST SURGERIES     TONSILLECTOMY Bilateral 10/17/2015   Procedure: TONSILLECTOMY;  Surgeon: Carolee Hunter, MD;  Location: [facility];  Service: ENT;  Laterality: Bilateral;    Family History  Problem Relation Age of Onset   Breast cancer Maternal Aunt 30   Uterine cancer Maternal Aunt    Hyperlipidemia Father    Hypertension Father    Lung cancer Father    Hyperlipidemia Mother    Hypertension Mother    Diabetes Maternal Uncle    Breast cancer Paternal Aunt 28    No Known Allergies  Current Outpatient Medications on File Prior to Visit  Medication Sig Dispense Refill   cholecalciferol (VITAMIN D3) 25 MCG (1000 UNIT) tablet      cyclobenzaprine  (FLEXERIL ) 10 MG tablet Take 1 tablet (10 mg total) by mouth 3 (three) times daily as needed for muscle spasms. 30 tablet 0   hydrocortisone  (ANUSOL -HC) 25 MG suppository Place 1 suppository (25 mg total) rectally 2 (two) times daily. 12 suppository 0   ketoconazole  (NIZORAL ) 2 % shampoo Wash scalp 3 (three) times a week. Leave in place for 30 minutes before rinsing. 120 mL 3   minoxidil  (LONITEN ) 2.5 MG tablet Take 1 tablet (2.5 mg total) by mouth daily. 90 tablet 1   Multiple Vitamin (MULTIVITAMIN) tablet Take 1 tablet by mouth daily.     naproxen  (NAPROSYN ) 500 MG tablet Take 1 tablet (500 mg total) by mouth 2 (two) times daily with a meal. 30 tablet 0   Omega-3 Fatty Acids (FISH OIL) 1000 MG CAPS      polyethylene glycol powder (GLYCOLAX/MIRALAX) powder Take 17 g by mouth daily as needed.     Probiotic Product (PROBIOTIC-10) CAPS Take 1 tablet by mouth daily.     spironolactone  (ALDACTONE ) 100 MG tablet Take 1 tablet (100 mg total) by mouth daily. 90 tablet 1   Zinc 100 MG TABS      No current facility-administered  medications on file prior to visit.    BP 116/76   Pulse 81   Temp (!) 97.5 F (36.4 C) (Temporal)   Ht 5' 4 (1.626 m)   Wt 186 lb (84.4 kg)   LMP 07/14/2023 (Approximate)   SpO2 100%   BMI 31.93 kg/m  Objective:   Physical Exam HENT:     Right Ear: Tympanic membrane and ear canal normal.     Left Ear: Tympanic membrane and ear canal normal.   Eyes:     Pupils: Pupils are equal, round, and reactive to light.    Cardiovascular:     Rate and Rhythm: Normal rate and regular  rhythm.  Pulmonary:     Effort: Pulmonary effort is normal.     Breath sounds: Normal breath sounds.  Abdominal:     General: Bowel sounds are normal.     Palpations: Abdomen is soft.     Tenderness: There is no abdominal tenderness.   Musculoskeletal:        General: Normal range of motion.     Cervical back: Neck supple.   Skin:    General: Skin is warm and dry.   Neurological:     Mental Status: She is alert and oriented to person, place, and time.     Cranial Nerves: No cranial nerve deficit.     Deep Tendon Reflexes:     Reflex Scores:      Patellar reflexes are 2+ on the right side and 2+ on the left side.  Psychiatric:        Mood and Affect: Mood normal.           Assessment & Plan:  Preventative health care Assessment & Plan: Immunizations UTD. Pap smear due, she declines today but will reschedule. Mammogram due, orders placed. Colonoscopy UTD, due 2026  Discussed the importance of a healthy diet and regular exercise in order for weight loss, and to reduce the risk of further co-morbidity.  Exam stable. Labs pending.  Follow up in 1 year for repeat physical.   Orders: -     Lipid panel -     Comprehensive metabolic panel with GFR -     Hemoglobin A1c  Screening mammogram for breast cancer -     3D Screening Mammogram, Left and Right; Future  Cold sore Assessment & Plan: Stable.  Continue Valtrex  2 g BID x 1 day. Refills provided today.  Orders: -      valACYclovir  HCl; Take 2 tablets twice daily for one day at cold sore onset.  Dispense: 12 tablet; Refill: 0  GAD (generalized anxiety disorder) Assessment & Plan: Stable on Zoloft .  Continue Zoloft  50 mg daily. Refills provided  Orders: -     Sertraline  HCl; Take 1 tablet (50 mg total) by mouth daily. For anxiety  Dispense: 90 tablet; Refill: 3  Prediabetes Assessment & Plan: Repeat A1c pending.  Orders: -     Lipid panel -     Comprehensive metabolic panel with GFR -     Hemoglobin A1c  Gastroesophageal reflux disease Assessment & Plan: Controlled.  Continue famotidine  20 mg as needed. Refills provided today.  Orders: -     Famotidine ; Take 1 tablet (20 mg total) by mouth 2 (two) times daily. As needed for heartburn.  Dispense: 180 tablet; Refill: 0  Family history of breast cancer Assessment & Plan: Mammogram due, orders placed.   Hair loss Assessment & Plan: Following with dermatology.  Continue spironolactone , minoxidil          Comer MARLA Gaskins, NP

## 2024-02-23 NOTE — Assessment & Plan Note (Signed)
 Controlled.  Continue famotidine  20 mg as needed. Refills provided today.

## 2024-02-23 NOTE — Assessment & Plan Note (Signed)
 Stable on Zoloft .  Continue Zoloft  50 mg daily. Refills provided

## 2024-02-23 NOTE — Assessment & Plan Note (Signed)
 Following with dermatology.  Continue spironolactone , minoxidil 

## 2024-02-23 NOTE — Patient Instructions (Signed)
 Stop by the lab prior to leaving today. I will notify you of your results once received.   [facility] your [facility].   It was a pleasure to see you today!

## 2024-02-23 NOTE — Assessment & Plan Note (Signed)
 Repeat A1c pending

## 2024-02-23 NOTE — Assessment & Plan Note (Signed)
Mammogram due, orders placed.

## 2024-03-11 ENCOUNTER — Other Ambulatory Visit: Payer: Self-pay

## 2024-03-22 ENCOUNTER — Telehealth: Admitting: Physician Assistant

## 2024-03-22 ENCOUNTER — Other Ambulatory Visit: Payer: Self-pay

## 2024-03-22 DIAGNOSIS — R3989 Other symptoms and signs involving the genitourinary system: Secondary | ICD-10-CM

## 2024-03-22 MED ORDER — CEPHALEXIN 500 MG PO CAPS
500.0000 mg | ORAL_CAPSULE | Freq: Two times a day (BID) | ORAL | 0 refills | Status: AC
  Filled 2024-03-22: qty 14, 7d supply, fill #0

## 2024-03-22 NOTE — Progress Notes (Signed)

## 2024-03-22 NOTE — Progress Notes (Signed)
 I have spent 5 minutes in review of e-visit questionnaire, review and updating patient chart, medical decision making and response to patient.   Piedad Climes, PA-C

## 2024-04-01 ENCOUNTER — Other Ambulatory Visit: Payer: Self-pay

## 2024-04-01 DIAGNOSIS — Z87898 Personal history of other specified conditions: Secondary | ICD-10-CM

## 2024-04-01 MED ORDER — SCOPOLAMINE 1 MG/3DAYS TD PT72
1.0000 | MEDICATED_PATCH | TRANSDERMAL | 0 refills | Status: AC
  Filled 2024-04-01: qty 10, 30d supply, fill #0

## 2024-04-14 ENCOUNTER — Ambulatory Visit (INDEPENDENT_AMBULATORY_CARE_PROVIDER_SITE_OTHER): Payer: Self-pay | Admitting: Primary Care

## 2024-04-14 ENCOUNTER — Encounter: Payer: Self-pay | Admitting: Primary Care

## 2024-04-14 ENCOUNTER — Other Ambulatory Visit (HOSPITAL_COMMUNITY)
Admission: RE | Admit: 2024-04-14 | Discharge: 2024-04-14 | Disposition: A | Discharge status: Home / Self Care | Source: Ambulatory Visit | Attending: Primary Care | Admitting: Primary Care

## 2024-04-14 VITALS — BP 132/84 | HR 86 | Temp 97.2°F | Ht 64.0 in | Wt 191.0 lb

## 2024-04-14 DIAGNOSIS — Z124 Encounter for screening for malignant neoplasm of cervix: Secondary | ICD-10-CM | POA: Diagnosis present

## 2024-04-14 DIAGNOSIS — R102 Pelvic and perineal pain: Secondary | ICD-10-CM | POA: Diagnosis not present

## 2024-04-14 NOTE — Assessment & Plan Note (Signed)
 Pap smear completed today.  Added STD testing per patient request. Patient tolerated well. Await results.

## 2024-04-14 NOTE — Patient Instructions (Signed)
 We will be in touch once we receive the results.  It was a pleasure to see you today!

## 2024-04-14 NOTE — Assessment & Plan Note (Signed)
 Resolved.  Wet prep collected and pending per patient request. She will update if her pelvic pressure returns.

## 2024-04-14 NOTE — Progress Notes (Signed)
 Subjective:    Patient ID: Kelly Sparks, female    DOB: 03-Jun-1976, 48 y.o.   MRN: [id number]  Gynecologic Exam The patient's pertinent negatives include no pelvic pain or vaginal discharge. Pertinent negatives include no dysuria or frequency.    Kelly Sparks is a very pleasant 48 y.o. female with a history of hemorrhoids, rectal bleeding, increased prolactin, elevated liver enzymes, prediabetes who presents today for Pap smear.  She completed her annual physical exam on 02/23/2024.  Her last Pap smear was 08/08/2020, NILM HPV negative.  She is asymptomatic today, however a few weeks ago she developed bladder/pelvic pressure.  She was evaluated through an e-visit on 03/22/2024, prescribed Keflex  twice daily x 7 days.  She completed the course of antibiotics but did not feel relief in her pressure.  Her pressure did sporadically resolve a few days ago.    She denies vaginal discharge, vaginal itching, hematuria, dysuria.   Review of Systems  Genitourinary:  Negative for dysuria, frequency, pelvic pain and vaginal discharge.         Past Medical History:  Diagnosis Date   Acid reflux    Acute pain of right shoulder 02/01/2024   Alopecia    BRCA negative 05/2017   BRCA/MyRisk except POLE VUS   DUB (dysfunctional uterine bleeding) 04/29/2017   Family history of breast cancer 05/2017   MyRisk neg; IBIS=14%   Hemorrhoids    Lower extremity pain 05/29/2022   Rectal bleeding 02/10/2013    Social History   Socioeconomic History   Marital status: Married    Spouse name: Not on file   Number of children: Not on file   Years of education: Not on file   Highest education level: Associate degree: academic program  Occupational History   Not on file  Tobacco Use   Smoking status: Never   Smokeless tobacco: Never  Vaping Use   Vaping status: Never Used  Substance and Sexual Activity   Alcohol use: Yes    Alcohol/week: 1.0 standard drink of alcohol    Types: 1 Standard  drinks or equivalent per week    Comment: occasional - 1 mixed drink/mo   Drug use: No   Sexual activity: Yes    Birth control/protection: Surgical    Comment: vasectomy  Other Topics Concern   Not on file  Social History Narrative   Not on file   Social Drivers of Health   Financial Resource Strain: Low Risk  (02/22/2024)   Overall Financial Resource Strain (CARDIA)    Difficulty of Paying Living Expenses: Not hard at all  Food Insecurity: No Food Insecurity (02/22/2024)   Hunger Vital Sign    Worried About Running Out of Food in the Last Year: Never true    Ran Out of Food in the Last Year: Never true  Transportation Needs: No Transportation Needs (02/22/2024)   PRAPARE - Administrator, Civil Service (Medical): No    Lack of Transportation (Non-Medical): No  Physical Activity: Insufficiently Active (02/22/2024)   Exercise Vital Sign    Days of Exercise per Week: 2 days    Minutes of Exercise per Session: 30 min  Stress: Stress Concern Present (02/22/2024)   [employer] of Occupational Health - Occupational Stress Questionnaire    Feeling of Stress: To some extent  Social Connections: Socially Integrated (02/22/2024)   Social Connection and Isolation Panel    Frequency of Communication with Friends and Family: More than three times a week  Frequency of Social Gatherings with Friends and Family: Twice a week    Attends Religious Services: More than 4 times per year    Active Member of Clubs or Organizations: Yes    Attends Banker Meetings: More than 4 times per year    Marital Status: Married  Catering manager Violence: Not on file    Past Surgical History:  Procedure Laterality Date   BIOPSY N/A 04/09/2020   Procedure: BIOPSY;  Surgeon: Janalyn Keene NOVAK, MD;  Location: [facility];  Service: Endoscopy;  Laterality: N/A;   COLONOSCOPY WITH PROPOFOL  N/A 04/09/2020   Procedure: COLONOSCOPY WITH PROPOFOL ;  Surgeon: Janalyn Keene NOVAK,  MD;  Location: [facility];  Service: Endoscopy;  Laterality: N/A;   NO PAST SURGERIES     TONSILLECTOMY Bilateral 10/17/2015   Procedure: TONSILLECTOMY;  Surgeon: Carolee Hunter, MD;  Location: [facility];  Service: ENT;  Laterality: Bilateral;    Family History  Problem Relation Age of Onset   Breast cancer Maternal Aunt 30   Uterine cancer Maternal Aunt    Hyperlipidemia Father    Hypertension Father    Lung cancer Father    Hyperlipidemia Mother    Hypertension Mother    Diabetes Maternal Uncle    Breast cancer Paternal Aunt 64    No Known Allergies  Current Outpatient Medications on File Prior to Visit  Medication Sig Dispense Refill   cholecalciferol (VITAMIN D3) 25 MCG (1000 UNIT) tablet      famotidine  (PEPCID ) 20 MG tablet Take 1 tablet (20 mg total) by mouth 2 (two) times daily. As needed for heartburn. 180 tablet 0   hydrocortisone  (ANUSOL -HC) 25 MG suppository Place 1 suppository (25 mg total) rectally 2 (two) times daily. 12 suppository 0   ketoconazole  (NIZORAL ) 2 % shampoo Wash scalp 3 (three) times a week. Leave in place for 30 minutes before rinsing. 120 mL 3   minoxidil  (LONITEN ) 2.5 MG tablet Take 1 tablet (2.5 mg total) by mouth daily. 90 tablet 1   Multiple Vitamin (MULTIVITAMIN) tablet Take 1 tablet by mouth daily.     naproxen  (NAPROSYN ) 500 MG tablet Take 1 tablet (500 mg total) by mouth 2 (two) times daily with a meal. 30 tablet 0   Omega-3 Fatty Acids (FISH OIL) 1000 MG CAPS      polyethylene glycol powder (GLYCOLAX/MIRALAX) powder Take 17 g by mouth daily as needed.     Probiotic Product (PROBIOTIC-10) CAPS Take 1 tablet by mouth daily.     sertraline  (ZOLOFT ) 50 MG tablet Take 1 tablet (50 mg total) by mouth daily. For anxiety 90 tablet 3   spironolactone  (ALDACTONE ) 100 MG tablet Take 1 tablet (100 mg total) by mouth daily. 90 tablet 1   valACYclovir  (VALTREX ) 1000 MG tablet Take 2 tablets twice daily for one day at cold sore onset. 12  tablet 0   Zinc 100 MG TABS      cyclobenzaprine  (FLEXERIL ) 10 MG tablet Take 1 tablet (10 mg total) by mouth 3 (three) times daily as needed for muscle spasms. (Patient not taking: Reported on 04/14/2024) 30 tablet 0   scopolamine  (TRANSDERM-SCOP) 1 MG/3DAYS Place 1 patch (1.5 mg total) onto the skin every 3 (three) days. For motion sickness prevention (Patient not taking: Reported on 04/14/2024) 10 patch 0   No current facility-administered medications on file prior to visit.    BP 132/84   Pulse 86   Temp (!) 97.2 F (36.2 C) (Temporal)   Ht 5'  4 (1.626 m)   Wt 191 lb (86.6 kg)   LMP 09/02/2023 (Approximate)   SpO2 99%   BMI 32.79 kg/m  Objective:   Physical Exam Exam conducted with a chaperone present.  Cardiovascular:     Rate and Rhythm: Normal rate.  Pulmonary:     Effort: Pulmonary effort is normal.  Genitourinary:    Labia:        Right: No rash, tenderness or lesion.        Left: No rash, tenderness or lesion.      Vagina: Vaginal discharge present.     Cervix: Normal.     Uterus: Normal.      Adnexa: Right adnexa normal and left adnexa normal.     Comments: Small amount of whitish vaginal discharge. Neurological:     Mental Status: She is alert.           Assessment & Plan:  Pelvic pressure in female Assessment & Plan: Resolved.  Wet prep collected and pending per patient request. She will update if her pelvic pressure returns.  Orders: -     WET PREP BY MOLECULAR PROBE  Screening for cervical cancer Assessment & Plan: Pap smear completed today.  Added STD testing per patient request. Patient tolerated well. Await results.  Orders: -     Cytology - PAP        Comer MARLA Gaskins, NP

## 2024-04-15 ENCOUNTER — Ambulatory Visit: Payer: Self-pay | Admitting: Primary Care

## 2024-04-15 ENCOUNTER — Other Ambulatory Visit: Payer: Self-pay

## 2024-04-15 DIAGNOSIS — B3731 Acute candidiasis of vulva and vagina: Secondary | ICD-10-CM

## 2024-04-15 DIAGNOSIS — B9689 Other specified bacterial agents as the cause of diseases classified elsewhere: Secondary | ICD-10-CM

## 2024-04-15 LAB — WET PREP BY MOLECULAR PROBE
Candida species: DETECTED — AB
MICRO NUMBER:: 16832148
SPECIMEN QUALITY:: ADEQUATE
Trichomonas vaginosis: NOT DETECTED

## 2024-04-15 MED ORDER — METRONIDAZOLE 500 MG PO TABS
500.0000 mg | ORAL_TABLET | Freq: Two times a day (BID) | ORAL | 0 refills | Status: AC
  Filled 2024-04-15: qty 14, 7d supply, fill #0

## 2024-04-15 MED ORDER — FLUCONAZOLE 150 MG PO TABS
150.0000 mg | ORAL_TABLET | Freq: Once | ORAL | 0 refills | Status: AC
  Filled 2024-04-15: qty 1, 1d supply, fill #0

## 2024-04-18 LAB — CYTOLOGY - PAP
Chlamydia: NEGATIVE
Comment: NEGATIVE
Comment: NEGATIVE
Comment: NEGATIVE
Comment: NORMAL
Diagnosis: NEGATIVE
High risk HPV: NEGATIVE
Neisseria Gonorrhea: NEGATIVE
Trichomonas: NEGATIVE

## 2024-04-28 ENCOUNTER — Other Ambulatory Visit: Payer: Self-pay

## 2024-04-29 ENCOUNTER — Other Ambulatory Visit: Payer: Self-pay

## 2024-05-02 ENCOUNTER — Other Ambulatory Visit: Payer: Self-pay

## 2024-05-03 ENCOUNTER — Other Ambulatory Visit: Payer: Self-pay

## 2024-05-03 MED ORDER — SPIRONOLACTONE 100 MG PO TABS
100.0000 mg | ORAL_TABLET | Freq: Every day | ORAL | 1 refills | Status: AC
  Filled 2024-05-03: qty 90, 90d supply, fill #0
  Filled 2024-09-10: qty 90, 90d supply, fill #1

## 2024-05-04 ENCOUNTER — Other Ambulatory Visit: Payer: Self-pay

## 2024-05-10 ENCOUNTER — Ambulatory Visit
Admission: RE | Admit: 2024-05-10 | Discharge: 2024-05-10 | Disposition: A | Discharge status: Home / Self Care | Source: Ambulatory Visit | Attending: Primary Care | Admitting: Primary Care

## 2024-05-10 DIAGNOSIS — Z1231 Encounter for screening mammogram for malignant neoplasm of breast: Secondary | ICD-10-CM | POA: Diagnosis present

## 2024-05-13 ENCOUNTER — Other Ambulatory Visit: Payer: Self-pay | Admitting: Primary Care

## 2024-05-13 DIAGNOSIS — R928 Other abnormal and inconclusive findings on diagnostic imaging of breast: Secondary | ICD-10-CM

## 2024-05-17 ENCOUNTER — Ambulatory Visit: Payer: Self-pay | Admitting: Primary Care

## 2024-05-17 ENCOUNTER — Ambulatory Visit
Admission: RE | Admit: 2024-05-17 | Discharge: 2024-05-17 | Disposition: A | Discharge status: Home / Self Care | Source: Ambulatory Visit | Attending: Primary Care | Admitting: Primary Care

## 2024-05-17 DIAGNOSIS — R928 Other abnormal and inconclusive findings on diagnostic imaging of breast: Secondary | ICD-10-CM | POA: Diagnosis present

## 2024-07-05 ENCOUNTER — Other Ambulatory Visit: Payer: Self-pay | Admitting: Medical Genetics

## 2024-07-05 DIAGNOSIS — Z006 Encounter for examination for normal comparison and control in clinical research program: Secondary | ICD-10-CM

## 2024-07-05 NOTE — Telephone Encounter (Signed)
Form placed in Kelli's inbox

## 2024-07-05 NOTE — Telephone Encounter (Signed)
 Form printed out and placed in KAte's inbox in her office

## 2024-07-06 NOTE — Telephone Encounter (Signed)
 Faxed form to [phone].

## 2024-09-10 ENCOUNTER — Other Ambulatory Visit: Payer: Self-pay

## 2024-09-11 ENCOUNTER — Other Ambulatory Visit: Payer: Self-pay

## 2024-09-12 ENCOUNTER — Other Ambulatory Visit: Payer: Self-pay

## 2024-09-13 ENCOUNTER — Other Ambulatory Visit: Payer: Self-pay

## 2024-09-13 MED ORDER — DUTASTERIDE 0.5 MG PO CAPS
0.5000 mg | ORAL_CAPSULE | Freq: Every day | ORAL | 1 refills | Status: AC
  Filled 2024-09-13: qty 90, 90d supply, fill #0

## 2024-09-13 MED ORDER — MINOXIDIL 2.5 MG PO TABS
2.5000 mg | ORAL_TABLET | Freq: Every day | ORAL | 1 refills | Status: AC
  Filled 2024-09-13: qty 90, 90d supply, fill #0

## 2024-09-13 MED ORDER — SPIRONOLACTONE 100 MG PO TABS
100.0000 mg | ORAL_TABLET | Freq: Every day | ORAL | 1 refills | Status: AC
  Filled 2024-09-13: qty 90, 90d supply, fill #0

## 2024-09-13 MED ORDER — KETOCONAZOLE 2 % EX SHAM
MEDICATED_SHAMPOO | CUTANEOUS | 6 refills | Status: AC
  Filled 2024-09-13: qty 120, 30d supply, fill #0

## 2024-10-07 ENCOUNTER — Other Ambulatory Visit: Payer: Self-pay | Admitting: Primary Care

## 2024-10-07 DIAGNOSIS — R928 Other abnormal and inconclusive findings on diagnostic imaging of breast: Secondary | ICD-10-CM

## 2024-11-23 ENCOUNTER — Other Ambulatory Visit

## 2024-11-23 ENCOUNTER — Encounter
